# Patient Record
Sex: Male | Born: 1935 | Race: Black or African American | Hispanic: No | Marital: Married | Smoking: Never smoker
Health system: Southern US, Community
[De-identification: ages and names within clinical notes are randomized; demographics above are authoritative.]

## PROBLEM LIST (undated history)

## (undated) DIAGNOSIS — R319 Hematuria, unspecified: Secondary | ICD-10-CM

## (undated) DIAGNOSIS — E785 Hyperlipidemia, unspecified: Secondary | ICD-10-CM

## (undated) DIAGNOSIS — IMO0002 Reserved for concepts with insufficient information to code with codable children: Secondary | ICD-10-CM

## (undated) DIAGNOSIS — N400 Enlarged prostate without lower urinary tract symptoms: Secondary | ICD-10-CM

## (undated) DIAGNOSIS — I442 Atrioventricular block, complete: Secondary | ICD-10-CM

## (undated) DIAGNOSIS — Z85038 Personal history of other malignant neoplasm of large intestine: Secondary | ICD-10-CM

## (undated) DIAGNOSIS — I2510 Atherosclerotic heart disease of native coronary artery without angina pectoris: Secondary | ICD-10-CM

## (undated) DIAGNOSIS — H409 Unspecified glaucoma: Secondary | ICD-10-CM

## (undated) DIAGNOSIS — H209 Unspecified iridocyclitis: Secondary | ICD-10-CM

## (undated) DIAGNOSIS — I10 Essential (primary) hypertension: Secondary | ICD-10-CM

## (undated) HISTORY — DX: Benign prostatic hyperplasia without lower urinary tract symptoms: N40.0

## (undated) HISTORY — DX: Personal history of other malignant neoplasm of large intestine: Z85.038

## (undated) HISTORY — DX: Unspecified iridocyclitis: H20.9

## (undated) HISTORY — DX: Unspecified glaucoma: H40.9

## (undated) HISTORY — DX: Atrioventricular block, complete: I44.2

## (undated) HISTORY — DX: Hematuria, unspecified: R31.9

## (undated) HISTORY — DX: Reserved for concepts with insufficient information to code with codable children: IMO0002

## (undated) HISTORY — DX: Atherosclerotic heart disease of native coronary artery without angina pectoris: I25.10

## (undated) HISTORY — DX: Hyperlipidemia, unspecified: E78.5

## (undated) HISTORY — DX: Essential (primary) hypertension: I10

## (undated) HISTORY — PX: CARDIAC CATHETERIZATION: SHX172

---

## 2000-05-30 HISTORY — PX: TRANSURETHRAL RESECTION OF PROSTATE: SHX73

## 2001-01-27 ENCOUNTER — Emergency Department (HOSPITAL_COMMUNITY): Admission: EM | Admit: 2001-01-27 | Discharge: 2001-01-27 | Payer: Self-pay | Admitting: Emergency Medicine

## 2001-01-29 ENCOUNTER — Emergency Department (HOSPITAL_COMMUNITY): Admission: EM | Admit: 2001-01-29 | Discharge: 2001-01-29 | Payer: Self-pay | Admitting: *Deleted

## 2001-02-02 ENCOUNTER — Encounter: Payer: Self-pay | Admitting: Urology

## 2001-02-06 ENCOUNTER — Inpatient Hospital Stay (HOSPITAL_COMMUNITY): Admission: RE | Admit: 2001-02-06 | Discharge: 2001-02-08 | Payer: Self-pay | Admitting: Urology

## 2001-02-06 ENCOUNTER — Encounter (INDEPENDENT_AMBULATORY_CARE_PROVIDER_SITE_OTHER): Payer: Self-pay | Admitting: Specialist

## 2007-03-30 ENCOUNTER — Encounter: Admission: RE | Admit: 2007-03-30 | Discharge: 2007-03-30 | Payer: Self-pay | Admitting: General Surgery

## 2007-04-30 HISTORY — PX: PARTIAL COLECTOMY: SHX5273

## 2007-05-01 ENCOUNTER — Encounter (INDEPENDENT_AMBULATORY_CARE_PROVIDER_SITE_OTHER): Payer: Self-pay | Admitting: General Surgery

## 2007-05-01 ENCOUNTER — Inpatient Hospital Stay (HOSPITAL_COMMUNITY): Admission: RE | Admit: 2007-05-01 | Discharge: 2007-05-05 | Payer: Self-pay | Admitting: Surgery

## 2007-05-09 ENCOUNTER — Ambulatory Visit: Payer: Self-pay | Admitting: Hematology & Oncology

## 2007-05-10 LAB — CBC WITH DIFFERENTIAL/PLATELET
Basophils Absolute: 0.1 10*3/uL (ref 0.0–0.1)
Eosinophils Absolute: 0.5 10*3/uL (ref 0.0–0.5)
HGB: 11.8 g/dL — ABNORMAL LOW (ref 13.0–17.1)
MONO#: 0.4 10*3/uL (ref 0.1–0.9)
MONO%: 4.9 % (ref 0.0–13.0)
NEUT#: 3.8 10*3/uL (ref 1.5–6.5)
RBC: 4.94 10*6/uL (ref 4.20–5.71)
RDW: 13.7 % (ref 11.2–14.6)
WBC: 7.5 10*3/uL (ref 4.0–10.0)
lymph#: 2.6 10*3/uL (ref 0.9–3.3)

## 2007-05-10 LAB — COMPREHENSIVE METABOLIC PANEL
Albumin: 3.5 g/dL (ref 3.5–5.2)
Alkaline Phosphatase: 138 U/L — ABNORMAL HIGH (ref 39–117)
BUN: 20 mg/dL (ref 6–23)
Calcium: 9.1 mg/dL (ref 8.4–10.5)
Chloride: 103 mEq/L (ref 96–112)
Glucose, Bld: 78 mg/dL (ref 70–99)
Potassium: 4.7 mEq/L (ref 3.5–5.3)
Sodium: 139 mEq/L (ref 135–145)
Total Protein: 6.8 g/dL (ref 6.0–8.3)

## 2007-06-07 LAB — CBC WITH DIFFERENTIAL/PLATELET
BASO%: 2.9 % — ABNORMAL HIGH (ref 0.0–2.0)
LYMPH%: 45.5 % (ref 14.0–48.0)
MCHC: 30.1 g/dL — ABNORMAL LOW (ref 32.0–35.9)
MCV: 77.2 fL — ABNORMAL LOW (ref 81.6–98.0)
MONO#: 0.9 10*3/uL (ref 0.1–0.9)
MONO%: 17.3 % — ABNORMAL HIGH (ref 0.0–13.0)
NEUT#: 1.6 10*3/uL (ref 1.5–6.5)
Platelets: 239 10*3/uL (ref 145–400)
RBC: 5.39 10*6/uL (ref 4.20–5.71)
RDW: 10.9 % — ABNORMAL LOW (ref 11.2–14.6)
WBC: 5.2 10*3/uL (ref 4.0–10.0)

## 2007-07-09 ENCOUNTER — Ambulatory Visit (HOSPITAL_COMMUNITY): Admission: RE | Admit: 2007-07-09 | Discharge: 2007-07-09 | Payer: Self-pay | Admitting: Hematology & Oncology

## 2007-08-07 ENCOUNTER — Ambulatory Visit: Payer: Self-pay | Admitting: Hematology & Oncology

## 2007-08-09 LAB — COMPREHENSIVE METABOLIC PANEL
ALT: 12 U/L (ref 0–53)
AST: 18 U/L (ref 0–37)
CO2: 27 mEq/L (ref 19–32)
Calcium: 9.2 mg/dL (ref 8.4–10.5)
Chloride: 108 mEq/L (ref 96–112)
Sodium: 145 mEq/L (ref 135–145)
Total Bilirubin: 0.9 mg/dL (ref 0.3–1.2)
Total Protein: 7.2 g/dL (ref 6.0–8.3)

## 2007-08-09 LAB — CEA: CEA: 1.1 ng/mL (ref 0.0–5.0)

## 2007-08-09 LAB — CBC WITH DIFFERENTIAL/PLATELET
BASO%: 1.3 % (ref 0.0–2.0)
EOS%: 4.5 % (ref 0.0–7.0)
MCHC: 32 g/dL (ref 32.0–35.9)
MONO#: 0.5 10*3/uL (ref 0.1–0.9)
RBC: 5.34 10*6/uL (ref 4.20–5.71)
RDW: 14 % (ref 11.2–14.6)
WBC: 5.2 10*3/uL (ref 4.0–10.0)
lymph#: 1.9 10*3/uL (ref 0.9–3.3)

## 2007-09-25 ENCOUNTER — Inpatient Hospital Stay (HOSPITAL_COMMUNITY): Admission: AD | Admit: 2007-09-25 | Discharge: 2007-09-27 | Payer: Self-pay | Admitting: Internal Medicine

## 2007-09-25 ENCOUNTER — Ambulatory Visit: Payer: Self-pay | Admitting: Hematology & Oncology

## 2007-10-09 ENCOUNTER — Ambulatory Visit (HOSPITAL_COMMUNITY): Admission: RE | Admit: 2007-10-09 | Discharge: 2007-10-09 | Payer: Self-pay | Admitting: Hematology & Oncology

## 2007-10-09 LAB — CBC WITH DIFFERENTIAL/PLATELET
BASO%: 2.1 % — ABNORMAL HIGH (ref 0.0–2.0)
EOS%: 5 % (ref 0.0–7.0)
MCH: 24.9 pg — ABNORMAL LOW (ref 28.0–33.4)
MCHC: 31.9 g/dL — ABNORMAL LOW (ref 32.0–35.9)
MONO#: 0.4 10*3/uL (ref 0.1–0.9)
RBC: 5.15 10*6/uL (ref 4.20–5.71)
WBC: 5.4 10*3/uL (ref 4.0–10.0)
lymph#: 1.6 10*3/uL (ref 0.9–3.3)

## 2007-10-09 LAB — COMPREHENSIVE METABOLIC PANEL
Albumin: 3.6 g/dL (ref 3.5–5.2)
CO2: 28 mEq/L (ref 19–32)
Calcium: 8.9 mg/dL (ref 8.4–10.5)
Chloride: 105 mEq/L (ref 96–112)
Glucose, Bld: 110 mg/dL — ABNORMAL HIGH (ref 70–99)
Potassium: 4.1 mEq/L (ref 3.5–5.3)
Sodium: 139 mEq/L (ref 135–145)
Total Bilirubin: 0.7 mg/dL (ref 0.3–1.2)
Total Protein: 7.3 g/dL (ref 6.0–8.3)

## 2007-10-09 LAB — CEA: CEA: 1.6 ng/mL (ref 0.0–5.0)

## 2007-11-28 ENCOUNTER — Ambulatory Visit (HOSPITAL_COMMUNITY): Admission: RE | Admit: 2007-11-28 | Discharge: 2007-11-28 | Payer: Self-pay | Admitting: Hematology & Oncology

## 2007-12-11 ENCOUNTER — Ambulatory Visit: Payer: Self-pay | Admitting: Hematology & Oncology

## 2007-12-13 LAB — COMPREHENSIVE METABOLIC PANEL
ALT: 11 U/L (ref 0–53)
Albumin: 4.1 g/dL (ref 3.5–5.2)
CO2: 25 mEq/L (ref 19–32)
Calcium: 9.2 mg/dL (ref 8.4–10.5)
Chloride: 106 mEq/L (ref 96–112)
Glucose, Bld: 92 mg/dL (ref 70–99)
Potassium: 4.4 mEq/L (ref 3.5–5.3)
Sodium: 142 mEq/L (ref 135–145)
Total Protein: 7 g/dL (ref 6.0–8.3)

## 2007-12-13 LAB — CBC WITH DIFFERENTIAL (CANCER CENTER ONLY)
BASO%: 0.7 % (ref 0.0–2.0)
HCT: 41.3 % (ref 38.7–49.9)
LYMPH#: 1.8 10*3/uL (ref 0.9–3.3)
MONO#: 0.4 10*3/uL (ref 0.1–0.9)
NEUT#: 2.6 10*3/uL (ref 1.5–6.5)
NEUT%: 51.4 % (ref 40.0–80.0)
RDW: 13.6 % (ref 10.5–14.6)
WBC: 5 10*3/uL (ref 4.0–10.0)

## 2007-12-13 LAB — CEA: CEA: 1.5 ng/mL (ref 0.0–5.0)

## 2008-03-07 ENCOUNTER — Ambulatory Visit (HOSPITAL_BASED_OUTPATIENT_CLINIC_OR_DEPARTMENT_OTHER): Admission: RE | Admit: 2008-03-07 | Discharge: 2008-03-07 | Payer: Self-pay | Admitting: Hematology & Oncology

## 2008-03-12 ENCOUNTER — Ambulatory Visit: Payer: Self-pay | Admitting: Hematology & Oncology

## 2008-03-13 LAB — COMPREHENSIVE METABOLIC PANEL
ALT: 14 U/L (ref 0–53)
BUN: 24 mg/dL — ABNORMAL HIGH (ref 6–23)
CO2: 26 mEq/L (ref 19–32)
Creatinine, Ser: 1.04 mg/dL (ref 0.40–1.50)
Total Bilirubin: 0.8 mg/dL (ref 0.3–1.2)

## 2008-03-13 LAB — CBC WITH DIFFERENTIAL (CANCER CENTER ONLY)
BASO%: 0.7 % (ref 0.0–2.0)
LYMPH#: 2 10*3/uL (ref 0.9–3.3)
LYMPH%: 41.5 % (ref 14.0–48.0)
MONO#: 0.3 10*3/uL (ref 0.1–0.9)
Platelets: 282 10*3/uL (ref 145–400)
RDW: 12.3 % (ref 10.5–14.6)
WBC: 4.8 10*3/uL (ref 4.0–10.0)

## 2008-03-13 LAB — CEA: CEA: 1.7 ng/mL (ref 0.0–5.0)

## 2008-06-30 ENCOUNTER — Ambulatory Visit: Payer: Self-pay | Admitting: Hematology & Oncology

## 2008-07-01 ENCOUNTER — Ambulatory Visit (HOSPITAL_BASED_OUTPATIENT_CLINIC_OR_DEPARTMENT_OTHER): Admission: RE | Admit: 2008-07-01 | Discharge: 2008-07-01 | Payer: Self-pay | Admitting: Hematology & Oncology

## 2008-07-01 ENCOUNTER — Ambulatory Visit: Payer: Self-pay | Admitting: Diagnostic Radiology

## 2008-07-01 LAB — CBC WITH DIFFERENTIAL (CANCER CENTER ONLY)
BASO%: 0.4 % (ref 0.0–2.0)
LYMPH%: 33.2 % (ref 14.0–48.0)
MCH: 23.4 pg — ABNORMAL LOW (ref 28.0–33.4)
MCV: 73 fL — ABNORMAL LOW (ref 82–98)
MONO%: 4.1 % (ref 0.0–13.0)
Platelets: 268 10*3/uL (ref 145–400)
RDW: 12.2 % (ref 10.5–14.6)

## 2008-07-01 LAB — CMP (CANCER CENTER ONLY)
ALT(SGPT): 19 U/L (ref 10–47)
AST: 30 U/L (ref 11–38)
CO2: 30 mEq/L (ref 18–33)
Chloride: 99 mEq/L (ref 98–108)
Sodium: 142 mEq/L (ref 128–145)
Total Bilirubin: 0.8 mg/dl (ref 0.20–1.60)
Total Protein: 7.8 g/dL (ref 6.4–8.1)

## 2008-07-01 LAB — CEA: CEA: 1.5 ng/mL (ref 0.0–5.0)

## 2008-10-24 ENCOUNTER — Ambulatory Visit: Payer: Self-pay | Admitting: Hematology & Oncology

## 2008-10-28 ENCOUNTER — Ambulatory Visit: Payer: Self-pay | Admitting: Diagnostic Radiology

## 2008-10-28 ENCOUNTER — Ambulatory Visit (HOSPITAL_BASED_OUTPATIENT_CLINIC_OR_DEPARTMENT_OTHER): Admission: RE | Admit: 2008-10-28 | Discharge: 2008-10-28 | Payer: Self-pay | Admitting: Hematology & Oncology

## 2008-10-28 LAB — CBC WITH DIFFERENTIAL (CANCER CENTER ONLY)
BASO#: 0 10*3/uL (ref 0.0–0.2)
BASO%: 0.5 % (ref 0.0–2.0)
Eosinophils Absolute: 0.1 10*3/uL (ref 0.0–0.5)
HCT: 41.8 % (ref 38.7–49.9)
HGB: 12.6 g/dL — ABNORMAL LOW (ref 13.0–17.1)
LYMPH#: 1.8 10*3/uL (ref 0.9–3.3)
MONO#: 0.3 10*3/uL (ref 0.1–0.9)
NEUT%: 53.1 % (ref 40.0–80.0)
RBC: 5.59 10*6/uL (ref 4.20–5.70)
RDW: 12.1 % (ref 10.5–14.6)
WBC: 4.8 10*3/uL (ref 4.0–10.0)

## 2008-10-28 LAB — CMP (CANCER CENTER ONLY)
ALT(SGPT): 21 U/L (ref 10–47)
BUN, Bld: 19 mg/dL (ref 7–22)
CO2: 29 mEq/L (ref 18–33)
Calcium: 9.4 mg/dL (ref 8.0–10.3)
Chloride: 103 mEq/L (ref 98–108)
Creat: 1.2 mg/dl (ref 0.6–1.2)
Total Bilirubin: 0.7 mg/dl (ref 0.20–1.60)

## 2008-10-28 LAB — CEA: CEA: 1.7 ng/mL (ref 0.0–5.0)

## 2008-11-27 HISTORY — PX: PACEMAKER INSERTION: SHX728

## 2008-12-21 ENCOUNTER — Inpatient Hospital Stay (HOSPITAL_COMMUNITY): Admission: EM | Admit: 2008-12-21 | Discharge: 2008-12-23 | Payer: Self-pay | Admitting: Emergency Medicine

## 2008-12-21 ENCOUNTER — Encounter (INDEPENDENT_AMBULATORY_CARE_PROVIDER_SITE_OTHER): Payer: Self-pay | Admitting: Internal Medicine

## 2008-12-21 ENCOUNTER — Ambulatory Visit: Payer: Self-pay | Admitting: Internal Medicine

## 2008-12-23 ENCOUNTER — Emergency Department (HOSPITAL_COMMUNITY): Admission: EM | Admit: 2008-12-23 | Discharge: 2008-12-23 | Payer: Self-pay | Admitting: Emergency Medicine

## 2009-03-17 ENCOUNTER — Ambulatory Visit: Payer: Self-pay | Admitting: Hematology & Oncology

## 2009-03-18 ENCOUNTER — Ambulatory Visit (HOSPITAL_BASED_OUTPATIENT_CLINIC_OR_DEPARTMENT_OTHER): Admission: RE | Admit: 2009-03-18 | Discharge: 2009-03-18 | Payer: Self-pay | Admitting: Hematology & Oncology

## 2009-03-18 ENCOUNTER — Ambulatory Visit: Payer: Self-pay | Admitting: Diagnostic Radiology

## 2009-03-18 LAB — CBC WITH DIFFERENTIAL (CANCER CENTER ONLY)
BASO%: 0.5 % (ref 0.0–2.0)
Eosinophils Absolute: 0.2 10*3/uL (ref 0.0–0.5)
LYMPH#: 2 10*3/uL (ref 0.9–3.3)
MONO#: 0.3 10*3/uL (ref 0.1–0.9)
NEUT#: 2.1 10*3/uL (ref 1.5–6.5)
Platelets: 283 10*3/uL (ref 145–400)
RBC: 5.55 10*6/uL (ref 4.20–5.70)
RDW: 12 % (ref 10.5–14.6)
WBC: 4.6 10*3/uL (ref 4.0–10.0)

## 2009-03-18 LAB — CMP (CANCER CENTER ONLY)
ALT(SGPT): 20 U/L (ref 10–47)
Albumin: 3.6 g/dL (ref 3.3–5.5)
Alkaline Phosphatase: 125 U/L — ABNORMAL HIGH (ref 26–84)
CO2: 30 mEq/L (ref 18–33)
Glucose, Bld: 100 mg/dL (ref 73–118)
Potassium: 4.6 mEq/L (ref 3.3–4.7)
Sodium: 143 mEq/L (ref 128–145)
Total Bilirubin: 0.8 mg/dl (ref 0.20–1.60)
Total Protein: 7.6 g/dL (ref 6.4–8.1)

## 2009-06-25 ENCOUNTER — Ambulatory Visit: Payer: Self-pay | Admitting: Hematology & Oncology

## 2009-09-21 ENCOUNTER — Ambulatory Visit: Payer: Self-pay | Admitting: Hematology & Oncology

## 2009-09-22 ENCOUNTER — Ambulatory Visit: Payer: Self-pay | Admitting: Diagnostic Radiology

## 2009-09-22 ENCOUNTER — Ambulatory Visit (HOSPITAL_BASED_OUTPATIENT_CLINIC_OR_DEPARTMENT_OTHER): Admission: RE | Admit: 2009-09-22 | Discharge: 2009-09-22 | Payer: Self-pay | Admitting: Hematology & Oncology

## 2009-09-22 LAB — CBC WITH DIFFERENTIAL (CANCER CENTER ONLY)
BASO#: 0 10*3/uL (ref 0.0–0.2)
EOS%: 2.9 % (ref 0.0–7.0)
Eosinophils Absolute: 0.2 10*3/uL (ref 0.0–0.5)
HGB: 13 g/dL (ref 13.0–17.1)
LYMPH#: 2.2 10*3/uL (ref 0.9–3.3)
MCHC: 30.7 g/dL — ABNORMAL LOW (ref 32.0–35.9)
NEUT#: 2.4 10*3/uL (ref 1.5–6.5)
RBC: 5.58 10*6/uL (ref 4.20–5.70)

## 2009-09-22 LAB — CMP (CANCER CENTER ONLY)
AST: 26 U/L (ref 11–38)
Albumin: 3.7 g/dL (ref 3.3–5.5)
BUN, Bld: 22 mg/dL (ref 7–22)
Calcium: 9.6 mg/dL (ref 8.0–10.3)
Chloride: 104 mEq/L (ref 98–108)
Potassium: 4 mEq/L (ref 3.3–4.7)
Sodium: 143 mEq/L (ref 128–145)
Total Protein: 7.8 g/dL (ref 6.4–8.1)

## 2010-02-02 ENCOUNTER — Ambulatory Visit: Payer: Self-pay | Admitting: Hematology & Oncology

## 2010-02-03 LAB — CBC WITH DIFFERENTIAL (CANCER CENTER ONLY)
BASO#: 0 10*3/uL (ref 0.0–0.2)
BASO%: 0.5 % (ref 0.0–2.0)
Eosinophils Absolute: 0.2 10*3/uL (ref 0.0–0.5)
HGB: 13.4 g/dL (ref 13.0–17.1)
LYMPH#: 1.9 10*3/uL (ref 0.9–3.3)
MCH: 23.9 pg — ABNORMAL LOW (ref 28.0–33.4)
MCV: 76 fL — ABNORMAL LOW (ref 82–98)
MONO#: 0.3 10*3/uL (ref 0.1–0.9)
MONO%: 6.2 % (ref 0.0–13.0)
NEUT%: 51 % (ref 40.0–80.0)
WBC: 4.9 10*3/uL (ref 4.0–10.0)

## 2010-02-03 LAB — COMPREHENSIVE METABOLIC PANEL
Albumin: 4 g/dL (ref 3.5–5.2)
BUN: 20 mg/dL (ref 6–23)
Calcium: 9.3 mg/dL (ref 8.4–10.5)
Chloride: 106 mEq/L (ref 96–112)
Glucose, Bld: 94 mg/dL (ref 70–99)
Potassium: 4.4 mEq/L (ref 3.5–5.3)

## 2010-06-20 ENCOUNTER — Encounter: Payer: Self-pay | Admitting: Hematology & Oncology

## 2010-08-05 ENCOUNTER — Other Ambulatory Visit: Payer: Self-pay | Admitting: Family

## 2010-08-05 ENCOUNTER — Other Ambulatory Visit: Payer: Self-pay | Admitting: Hematology & Oncology

## 2010-08-05 ENCOUNTER — Encounter (HOSPITAL_BASED_OUTPATIENT_CLINIC_OR_DEPARTMENT_OTHER): Payer: Medicare Other | Admitting: Hematology & Oncology

## 2010-08-05 DIAGNOSIS — C189 Malignant neoplasm of colon, unspecified: Secondary | ICD-10-CM

## 2010-08-05 LAB — COMPREHENSIVE METABOLIC PANEL
ALT: 14 U/L (ref 0–53)
BUN: 19 mg/dL (ref 6–23)
CO2: 28 mEq/L (ref 19–32)
Calcium: 9.2 mg/dL (ref 8.4–10.5)
Creatinine, Ser: 1.1 mg/dL (ref 0.40–1.50)
Glucose, Bld: 105 mg/dL — ABNORMAL HIGH (ref 70–99)
Total Bilirubin: 1 mg/dL (ref 0.3–1.2)

## 2010-08-05 LAB — CBC WITH DIFFERENTIAL (CANCER CENTER ONLY)
BASO#: 0.1 10*3/uL (ref 0.0–0.2)
Eosinophils Absolute: 0.3 10*3/uL (ref 0.0–0.5)
HCT: 42.1 % (ref 38.7–49.9)
HGB: 13.5 g/dL (ref 13.0–17.1)
LYMPH#: 1.7 10*3/uL (ref 0.9–3.3)
LYMPH%: 31.1 % (ref 14.0–48.0)
MCV: 73 fL — ABNORMAL LOW (ref 82–98)
MONO#: 0.6 10*3/uL (ref 0.1–0.9)
NEUT%: 50.6 % (ref 40.0–80.0)
RBC: 5.74 10*6/uL — ABNORMAL HIGH (ref 4.20–5.70)
WBC: 5.5 10*3/uL (ref 4.0–10.0)

## 2010-08-05 LAB — CEA: CEA: 1.7 ng/mL (ref 0.0–5.0)

## 2010-09-05 LAB — BASIC METABOLIC PANEL
BUN: 13 mg/dL (ref 6–23)
CO2: 26 mEq/L (ref 19–32)
Chloride: 106 mEq/L (ref 96–112)
Glucose, Bld: 140 mg/dL — ABNORMAL HIGH (ref 70–99)
Potassium: 3.5 mEq/L (ref 3.5–5.1)

## 2010-09-05 LAB — CBC
HCT: 37.2 % — ABNORMAL LOW (ref 39.0–52.0)
HCT: 37.7 % — ABNORMAL LOW (ref 39.0–52.0)
HCT: 40.7 % (ref 39.0–52.0)
Hemoglobin: 11.9 g/dL — ABNORMAL LOW (ref 13.0–17.0)
MCHC: 31.7 g/dL (ref 30.0–36.0)
MCV: 74.3 fL — ABNORMAL LOW (ref 78.0–100.0)
MCV: 75.1 fL — ABNORMAL LOW (ref 78.0–100.0)
Platelets: 205 10*3/uL (ref 150–400)
Platelets: 217 10*3/uL (ref 150–400)
Platelets: 255 10*3/uL (ref 150–400)
RDW: 15 % (ref 11.5–15.5)
WBC: 8.1 10*3/uL (ref 4.0–10.5)
WBC: 8.1 10*3/uL (ref 4.0–10.5)
WBC: 8.2 10*3/uL (ref 4.0–10.5)

## 2010-09-05 LAB — POCT I-STAT, CHEM 8
BUN: 22 mg/dL (ref 6–23)
Calcium, Ion: 1.13 mmol/L (ref 1.12–1.32)
Creatinine, Ser: 1.2 mg/dL (ref 0.4–1.5)
Glucose, Bld: 108 mg/dL — ABNORMAL HIGH (ref 70–99)
Hemoglobin: 13.9 g/dL (ref 13.0–17.0)
Sodium: 145 mEq/L (ref 135–145)
TCO2: 25 mmol/L (ref 0–100)

## 2010-09-05 LAB — URINALYSIS, MICROSCOPIC ONLY
Ketones, ur: NEGATIVE mg/dL
Leukocytes, UA: NEGATIVE
Nitrite: NEGATIVE
Protein, ur: NEGATIVE mg/dL
Urobilinogen, UA: 1 mg/dL (ref 0.0–1.0)

## 2010-09-05 LAB — COMPREHENSIVE METABOLIC PANEL
AST: 29 U/L (ref 0–37)
Albumin: 3.4 g/dL — ABNORMAL LOW (ref 3.5–5.2)
Alkaline Phosphatase: 102 U/L (ref 39–117)
BUN: 18 mg/dL (ref 6–23)
Chloride: 107 mEq/L (ref 96–112)
GFR calc Af Amer: >60 mL/min (ref >60)
Potassium: 4 mEq/L (ref 3.5–5.1)
Total Bilirubin: 0.8 mg/dL (ref 0.3–1.2)
Total Protein: 6.4 g/dL (ref 6.0–8.3)

## 2010-09-05 LAB — CARDIAC PANEL(CRET KIN+CKTOT+MB+TROPI)
CK, MB: 2.2 ng/mL (ref 0.3–4.0)
CK, MB: 2.5 ng/mL (ref 0.3–4.0)
Relative Index: 1.8 (ref 0.0–2.5)
Relative Index: 2.4 (ref 0.0–2.5)
Total CK: 106 U/L (ref 7–232)
Total CK: 122 U/L (ref 7–232)
Troponin I: 0.03 ng/mL (ref 0.00–0.06)

## 2010-09-05 LAB — DIFFERENTIAL
Basophils Relative: 0 % (ref 0–1)
Eosinophils Absolute: 0.1 10*3/uL (ref 0.0–0.7)
Eosinophils Absolute: 0.3 10*3/uL (ref 0.0–0.7)
Eosinophils Relative: 0 % (ref 0–5)
Eosinophils Relative: 2 % (ref 0–5)
Eosinophils Relative: 3 % (ref 0–5)
Lymphocytes Relative: 34 % (ref 12–46)
Lymphocytes Relative: 7 % — ABNORMAL LOW (ref 12–46)
Lymphs Abs: 1.4 10*3/uL (ref 0.7–4.0)
Lymphs Abs: 2.8 10*3/uL (ref 0.7–4.0)
Monocytes Absolute: 0.1 10*3/uL (ref 0.1–1.0)
Monocytes Absolute: 0.8 10*3/uL (ref 0.1–1.0)
Monocytes Relative: 1 % — ABNORMAL LOW (ref 3–12)
Monocytes Relative: 9 % (ref 3–12)
Neutro Abs: 7.5 10*3/uL (ref 1.7–7.7)

## 2010-09-05 LAB — TSH: TSH: 0.929 u[IU]/mL (ref 0.350–4.500)

## 2010-09-05 LAB — PROTIME-INR: Prothrombin Time: 14.5 seconds (ref 11.6–15.2)

## 2010-09-05 LAB — URINE CULTURE
Colony Count: NO GROWTH
Culture: NO GROWTH

## 2010-09-05 LAB — SAMPLE TO BLOOD BANK

## 2010-09-05 LAB — MRSA PCR SCREENING: MRSA by PCR: NEGATIVE

## 2010-09-05 LAB — POCT CARDIAC MARKERS: Myoglobin, poc: 66.9 ng/mL (ref 12–200)

## 2010-09-05 LAB — APTT: aPTT: 26 seconds (ref 24–37)

## 2010-09-17 ENCOUNTER — Ambulatory Visit (HOSPITAL_BASED_OUTPATIENT_CLINIC_OR_DEPARTMENT_OTHER)
Admission: RE | Admit: 2010-09-17 | Discharge: 2010-09-17 | Disposition: A | Discharge status: Home / Self Care | Payer: Medicare Other | Source: Ambulatory Visit | Attending: Hematology & Oncology | Admitting: Hematology & Oncology

## 2010-09-17 DIAGNOSIS — C189 Malignant neoplasm of colon, unspecified: Secondary | ICD-10-CM | POA: Insufficient documentation

## 2010-09-17 MED ORDER — IOHEXOL 300 MG/ML  SOLN
100.0000 mL | Freq: Once | INTRAMUSCULAR | Status: AC | PRN
  Administered 2010-09-17: 100 mL via INTRAVENOUS

## 2010-10-12 NOTE — Discharge Summary (Signed)
[name], [name]              ACCOUNT NO.:  [id number]   MEDICAL RECORD NO.:  [id number]          PATIENT TYPE:  INP   LOCATION:  1532                         FACILITY:  [facility]   PHYSICIAN:  Adolph Pollack, M.D.DATE OF BIRTH:  10/08/1924   DATE OF ADMISSION:  05/01/2007  DATE OF DISCHARGE:  05/05/2007                               DISCHARGE SUMMARY   FINAL DIAGNOSIS:  Stage III colon cancer (T1N1).   SECONDARY DIAGNOSES:  1. Mild postoperative ileus.  2. Hypertension.  3. Glaucoma.   PROCEDURE:  Laparoscopic-assisted sigmoid colectomy.   REASON FOR ADMISSION:  This 77 year old male underwent screening  colonoscopy and was found to have a number of colonic polyps.  He had a  polyp in the sigmoid colon that was positive for adenocarcinoma.  He was  admitted for operative intervention.   HOSPITAL COURSE:  He underwent a laparoscopic sigmoid colectomy which he  tolerated well.  Postoperatively, he had a mild postop ileus, but had  return of bowel function by his third postoperative day.  Pathology came  back with 3 of 15 lymph nodes positive for cancer making him stage III  and this was explained to him.  His Foley was removed on the third  postoperative day.  By the fourth postoperative day, he was tolerating  his diet with good bowel activity and he was able to be discharged.   DISPOSITION:  Discharged to home May 05, 2007 in satisfactory  condition.   DISCHARGE INSTRUCTIONS:  1. He will return in about 4 days to have his staples removed.  2. He was given Vicodin for pain and was given discharge instructions.  3. He was told to continue his home medicines as well.      Adolph Pollack, M.D.  Electronically Signed     TJR/MEDQ  D:  06/19/2007  T:  06/19/2007  Job:  [id number]   cc:   Danise Edge, M.D.  Fax: [phone]

## 2010-10-12 NOTE — H&P (Signed)
[name], [name]              ACCOUNT NO.:  [id number]   MEDICAL RECORD NO.:  [id number]          PATIENT TYPE:  INP   LOCATION:  2304                         FACILITY:  [facility]   PHYSICIAN:  Wendi Snipes, MD DATE OF BIRTH:  [birth year]   DATE OF ADMISSION:  12/21/2008  DATE OF DISCHARGE:                              HISTORY & PHYSICAL   TIME OF EXAM:  12:38 a.m.   CARDIOLOGIST:  None.   PRIMARY DOCTOR:  Dr. Donette Larry.   CHIEF COMPLAINT:  Syncope.   HISTORY OF PRESENT ILLNESS:  This is an89 year old African-American,  active male who was with his family tonight when he felt flushed.  The  family then noted that he passed out for a few moments.  The family  called EMS, and he did this several more times before EMS arrived.  On  arrival EMS found him to be in high degree AV block, and the patient had  several more syncopal episodes prior to arriving the ED.  In the ED he  received dopamine, and transcutaneous pacer pads were placed as he was  continuing to wax and wane in and out of third-degree AV block without  an escape rhythm.  The patient described his symptoms as warm, flushed  feeling prior to his syncopal episodes and denies chest pain, exertional  anginal, lower extremity edema, paroxysmal nocturnal dyspnea, or  orthopnea.  Prior to this episode he has been in his usual state of  health and enjoying a very active lifestyle around his house.   PAST MEDICAL HISTORY:  1. Colon cancer, stage III status post colectomy in December 2008.  2. Prostate cancer.  3. Hypertension.  4. Glaucoma.   ALLERGIES:  No known drug allergies.   MEDICATIONS ON ADMISSION:  Amlodipine 5 mg daily, aspirin 81 mg daily,  and Protonix 40 mg twice daily.   SOCIAL HISTORY:  He lives in [place] with his wife.  He denies ever  using tobacco.   FAMILY HISTORY:  His mother had a pacemaker. Other than that there is no  early coronary artery disease.   REVIEW OF SYSTEMS:  All 14 systems  were reviewed and were negative  except as mentioned detail in the HPI.   PHYSICAL EXAM:  VITAL SIGNS:  His blood pressure is 74/56, respiratory  16.  His pulse is 62 beats per minute.  He is satting 98% on 10 liters  non-rebreather mask  GENERAL:  He is an 77 year old African American male in mild acute  distress.  HEENT: Moist mucous membranes.  Pupils equal, round, reactive to light  accommodation.  Anicteric sclerae.  NECK:  No jugular venous distention.  No thyromegaly.  CARDIOVASCULAR:  Regular rate and rhythm.  No murmurs, rubs or gallops.  LUNGS: Clear to auscultation bilaterally.  ABDOMEN:  Nontender, nondistended.  Positive bowel sounds.  No masses.  EXTREMITIES: No clubbing, cyanosis, edema.  NEUROLOGIC: Alert and oriented x3.  Cranial nerves II-XII grossly  intact.  No focal neurologic deficit.  SKIN:  Warm, dry and intact.  No rashes.  PSYCH:  Mood and affect are appropriate.   Radiology  is pending.  EKG had sinus rhythm at 60 beats per minute with  nonspecific ST changes and a right bundle branch block.  However, on  telemetry there is transient third-degree AV block without escape.   LABORATORY WORK:  Currently pending.   ASSESSMENT/PLAN:  This is an 77 year old African-American male here with  high-degree arteriovenous block and syncope.  1. High degree arteriovenous block.  Will continue transcutaneous      pacing to bridge to the cath lab for placement of her emergent      transvenous pacer wire.  We will start dopamine at this time.  Will      plan to admit him to the hospital and plan for permanent pacemaker      placement prior to his discharge.  Will hold his AV nodal blockers,      though he is not on any, and check an echocardiogram and TSH at      this time.  2. Chest pain. This is very unlikely ischemia.  However, he does have      risk factor and this could represent a right coronary lesion.  Will      check biomarkers and discuss with the  interventional cardiologist      about possible an angiography at the time of pacemaker placement.      Wendi Snipes, MD  Electronically Signed     BHH/MEDQ  D:  12/21/2008  T:  12/21/2008  Job:  [id number]

## 2010-10-12 NOTE — Consult Note (Signed)
[name], [name]              ACCOUNT NO.:  [id number]   MEDICAL RECORD NO.:  [id number]          PATIENT TYPE:  INP   LOCATION:  3021                         FACILITY:  [facility]   PHYSICIAN:  Graylin Shiver, M.D.   DATE OF BIRTH:  [birth year]   DATE OF CONSULTATION:  09/25/2007  DATE OF DISCHARGE:                                 CONSULTATION   REASON FOR CONSULTATION:  The patient is an 77 year old black male  admitted to the hospital today through the office of Dr. Georgann Housekeeper.  The patient was feeling weak, dizzy, and somewhat tired recently.  He  has been having black stools for the past 4 days.  He went to see Dr.  Donette Larry who checked his stool, and it was heme positive and also did a  CBC showing a hemoglobin of around 7.  The patient denies any  hematemesis or abdominal pain.  He has never had a peptic ulcer to his  knowledge.   PAST MEDICAL HISTORY:  Hypertension.   PAST SURGICAL HISTORY:  He had recent laparoscopic-assisted sigmoid  colectomy for colon cancer, prior history also of prostate surgery for  enlarged prostate.   CURRENT MEDICATIONS:  One aspirin daily.  He takes a blood pressure  medicine, does not know the name of it.   SOCIAL HISTORY:  Does not smoke or drink alcohol.   ALLERGIES:  None known.   REVIEW OF SYSTEMS:  Systems reviewed, no chest pain, shortness of  breath, cough, or sputum production.   PHYSICAL EXAMINATION:  GENERAL: He does not appear in any distress.  Ambulated here to the hospital on his own for elective admission.  NECK: Supple.  HEART: Regular rhythm.  No murmurs.  LUNGS: Clear.  ABDOMEN: Soft and nontender.  No hepatosplenomegaly.   IMPRESSION:  1. Melena.  2. Anemia.  3. History of sigmoid colon cancer.   PLAN:  The patient is being admitted to the hospital by Dr. Donette Larry.  Further medical orders will be written by him.  We will set him up for  EGD tomorrow to evaluate upper GI tract, given the fact that he has been  having melena for the past 4 days.  He had a colonoscopy back in  December of last year, I am not convinced with the melena that we need  to repeat this, but we will make further decisions after endoscopy is  done.           ______________________________  Graylin Shiver, M.D.     SFG/MEDQ  D:  09/25/2007  T:  09/26/2007  Job:  [id number]   cc:   Rose Phi. Myna Hidalgo, M.D.  Adolph Pollack, M.D.  Danise Edge, M.D.  Georgann Housekeeper, MD

## 2010-10-12 NOTE — Op Note (Signed)
[name], [name]              ACCOUNT NO.:  [id number]   MEDICAL RECORD NO.:  [id number]          PATIENT TYPE:  INP   LOCATION:  3021                         FACILITY:  [facility]   PHYSICIAN:  Graylin Shiver, M.D.   DATE OF BIRTH:  12-Sep-1924   DATE OF PROCEDURE:  09/26/2007  DATE OF DISCHARGE:                               OPERATIVE REPORT   PROCEDURE PERFORMED:  Esophagogastroduodenoscopy and biopsy for CLO  test.   INDICATIONS:  Melena and anemia.   Informed consent was obtained after explanation of the risks of  bleeding, infection, and perforation.   PREMEDICATION:  Fentanyl 50 mcg IV, Versed 5 mg IV.   PROCEDURE IN DETAIL:  With the patient in the left lateral decubitus  position, the Pentax gastroscope was inserted into the oropharynx and  passed into the esophagus.  It was advanced down the esophagus, then  into the stomach and into the duodenum.  The second portion of the  duodenum looked normal.  In the bulb of the duodenum, there were 2  ulcers, one was a linear ulcer about 1 cm in length and the other was  more of a roundish ulcer.  There was no evidence of active bleeding.  There were no visible vessels.  The stomach was then inspected.  There  was some erythema in the antrum.  A biopsy for CLO test was obtained.  The body of the stomach looked normal.  The fundus and cardia looked  normal.  The esophagus looked normal.  He tolerated the procedure well  without complications.   IMPRESSION:  Two ulcers in the duodenal bulb and some mild gastritis.  The biopsy for CLO test was obtained to look for evidence of  Helicobacter pylori.  There is no evidence of active bleeding.   PLAN:  Continue proton pump inhibitor.  Follow clinically.           ______________________________  Graylin Shiver, M.D.     SFG/MEDQ  D:  09/26/2007  T:  09/26/2007  Job:  [id number]   cc:   Georgann Housekeeper, MD

## 2010-10-12 NOTE — H&P (Signed)
NAMEDAMONTAE, LOPPNOW              ACCOUNT NO.:  [id number]   MEDICAL RECORD NO.:  [id number]          PATIENT TYPE:  INP   LOCATION:  3021                         FACILITY:  [facility]   PHYSICIAN:  Georgann Housekeeper, MD      DATE OF BIRTH:  [birth year]   DATE OF ADMISSION:  09/25/2007  DATE OF DISCHARGE:                              HISTORY & PHYSICAL   CHIEF COMPLAINT:  Black stools and feeling dizzy.   HISTORY OF PRESENT ILLNESS:  An 77 year old male with history of  hypertension, colon cancer, status post colectomy December 2008,  followed by Dr. Myna Hidalgo for oncology, comes in complaining of black  stools since Saturday.  He noticed that he continues to have some black  stools since then.  No fresh blood.  No nausea or vomiting.  No fevers,  no chills.  No chest pain.  He does complain of some dizziness when he  try to get up and walk, but not all the time.  He has been taking  aspirin 81 mg.  No other new medication.  No recent antibiotics.  He has  had recent CT scan by the oncologist, which was negative in the past.  He is scheduled to have another one which is a 55-month follow-up as far  as he is in the office, his blood pressure was stable.  He was not  orthostatic.  Hemoglobin was found to be 7.7 with BUN of 29, mildly  elevated.  His previous blood counts at the oncologist were normal.  The  patient admitted for further workup.   ALLERGIES:  No known drug allergies.   CURRENT MEDICATIONS:  1. Norvasc 5 mg daily.  2. Lumigan eye drops.  3. Aspirin 81 mg daily.   PAST MEDICAL HISTORY:  1. Colon cancer stage III.  2. He has status post colectomy December 2008.  3. Hypertension.  4. Glaucoma.  5. BPH.   SURGICAL HISTORY:  Colectomy.  Last colonoscopy October 2008.   SOCIAL HISTORY:  He is married, one son.  No tobacco, no alcohol.   FAMILY HISTORY:  Noncontributory.   PHYSICAL EXAMINATION:  VITAL SIGNS:  Blood pressure 132/80, standing  130/80, pulse 68, temperature  98.3, and weight of 175.  HEENT:  Pupils reactive.  NECK:  Supple.  LUNGS:  Clear.  CARDIAC:  Regular rhythm without murmurs.  No tachycardia.  ABDOMEN:  Soft, good bowel sounds, nontender, nondistended.  RECTAL:  Guaiac positive, melanotic stool, no fresh blood.   LAB DATA:  Hemoglobin 7.7, white count 10.8, platelets 444, sodium 142,  potassium 3.9, BUN of 29, and creatinine 1.1.  LFTs are normal.  Sugar  115.   IMPRESSION:  An 77 year old male with black stools, dizziness, possible  GI bleed, probable upper GI, history of colon cancer, and history of  dizziness.   PLAN:  Admit to hospital for IV fluids, transfuse 2 units of blood  products, follow H&H every 6 hours, GI consult, hold aspirin, start him  on Protonix IV for hypertension, hold Norvasc, colon cancer.  Last  colonoscopy October 2008.  He is followed by Dr. Myna Hidalgo, who  supposed  to get another CT scan.  We will get a workup first with a GI, and see  if he need the CT scan, we can do it in the hospital or outpatient.      Georgann Housekeeper, MD  Electronically Signed     KH/MEDQ  D:  09/25/2007  T:  09/26/2007  Job:  [id number]

## 2010-10-12 NOTE — Op Note (Signed)
[name], [name]              ACCOUNT NO.:  [id number]   MEDICAL RECORD NO.:  [id number]          PATIENT TYPE:  INP   LOCATION:  0006                         [facility]ACILITY[facility]   PHYSICIAN:  Adolph Pollack, M.D.DATE OF BIRTH:  [birth year]   DATE OF PROCEDURE:  05/01/2007  DATE OF DISCHARGE:                               OPERATIVE REPORT   PREOP DIAGNOSIS:  Sigmoid colon cancer.   POSTOPERATIVE DIAGNOSIS:  Sigmoid colon cancer.   PROCEDURE:  Laparoscopic-assisted sigmoid colectomy with partial  mobilization of splenic flexure.   SURGEON:  Avel Peace, M.D.   ASSISTANT:  Claud Kelp, MD   ANESTHESIA:  General.   INDICATIONS:  [name] is an 77 year old male undergoing a screening  colonoscopy and some polyps were found.  A polyp at 40 cm was biopsied  and was positive for adenocarcinoma.  This area was tattooed.  CEA level  preop is normal.  A CT scan demonstrates no evidence of metastatic  disease.  He now presents for elective partial colectomy.   TECHNIQUE:  He is seen in the holding area and brought to the operating  room, placed supine on the operating table and a general anesthetic was  administered.  A Foley catheter was inserted.  He was placed in the  lithotomy position and the hair on the abdominal wall was clipped and  the area was sterilely prepped and draped.  A small supraumbilical  incision was made through the skin, subcutaneous tissue, fascia and  peritoneum entering the peritoneal cavity under direct vision.  A  pursestring suture of zero Vicryl was placed around the fascial edges.  A Hassan trocar was introduced into the peritoneal cavity.  Pneumoperitoneum created by insufflation of CO2 gas.   The angled laparoscope was introduced under direct vision.  A 5 mm  trocar was placed through a small incision in the right lower quadrant.  A bowel clamp was then inserted through the abdominal cavity.  The  sigmoid colon was identified.  I began  manipulating it and around the  mid sigmoid area I noticed the tattoo mark.  A 5 mm trocar was then  placed in the suprapubic region and one in the left lower quadrant.  I  then divided the lateral attachments of the descending and sigmoid colon  down to the rectosigmoid junction and used blunt dissection to  medialized the colon.  I identified the gonadal vessels and kept my  plane of dissection anterior to these so as to avoid the ureter.  I then  approached the splenic flexure and partially immobilized it, dividing  attachments using electrocautery.  There was a lot of mobility of the  descending colon and rectosigmoid junction.  I was able to bring the two  together without any significant tension.   Following this I removed the 5 mm trocar in the suprapubic region and  made an extraction site longitudinal midline incision through the skin,  subcutaneous tissue, fascia and peritoneum.  The wound protection device  was placed.  I then brought up the sigmoid colon into the wound.  I  divided it at the  descending colon, sigmoid colon junction and near the  rectosigmoid junction with the GIA stapler.  A wedge resection of the  mesentery was performed using the LigaSure, staying above the plane of  the ureter.  The distal specimen was marked with a suture.  I brought  this specimen to the back table, opened it up and noted the polypectomy  site which was right where the tattoo was.  This was sent to pathology.   Gloves were changed.  I then was able to approximate the descending  colon and rectosigmoid junction in a side-to-side type fashion.  I  created a side-to-side stapled anastomosis with the GIA stapler.  The  common defect was closed with a linear non cutting stapler.  A crotch  stitch of 3-0 silk was placed.  The anastomosis was patent, viable,  under no tension, and placed back in the abdominal cavity.   Gloves again were changed.  The abdominal cavity was irrigated with   saline solution.  The air and fluid was evacuated.  I saw no obvious  bleeding was noted.   At this point, needle, sponge and instrument counts were requested and  reported to be correct.  I then closed the fascia of the extraction site  incision with a running #1 PDS suture.  I reinsufflated the abdomen and  inserted the laparoscope.  There was no evidence of bleeding, just some  old blood around the anastomosis which I rinsed off and evacuated.  I  inspected the anastomosis and it was hemostatic.  The midline closure  was solid.   I then released the pneumoperitoneum and removed the trocars.  The  supraumbilical fascial defect was closed by tightening up and tying down  the pursestring suture.  Subcutaneous tissue of the incisions were  irrigated and the skin incisions were then closed with staples followed  by sterile dressings.   He tolerated the procedure without apparent complications and was taken  to the recovery room in satisfactory condition.      Adolph Pollack, M.D.  Electronically Signed     TJR/MEDQ  D:  05/01/2007  T:  05/01/2007  Job:  [id number]   cc:   Georgann Housekeeper, MD  Fax: [phone]   Danise Edge, M.D.  Fax: [phone]

## 2010-10-12 NOTE — Discharge Summary (Signed)
NAMEROCKIE, SCHNOOR              ACCOUNT NO.:  [id number]   MEDICAL RECORD NO.:  [id number]          PATIENT TYPE:  INP   LOCATION:  3021                         FACILITY:  [facility]   PHYSICIAN:  Georgann Housekeeper, MD      DATE OF BIRTH:  08-19-1924   DATE OF ADMISSION:  09/25/2007  DATE OF DISCHARGE:  09/27/2007                               DISCHARGE SUMMARY   DISCHARGE DIAGNOSES:  1. Upper gastrointestinal bleed.  2. Duodenal ulcer.  3. Helicobacter pylori positive.  4. History of colon cancer status post colectomy December 2008.  5. Hypertension.  6. Glaucoma.   MEDICATION ON DISCHARGE:  1. Protonix 40 mg b.i.d.  2. Amoxicillin 500 mg 2 tablets twice a day for 14 days.  3. Biaxin 500 mg b.i.d. for 14 days.  4. Eyedrops as for home use.  5. Norvasc 5 mg on hold, restart on Oct 01, 2007.   CONSULTATION:  GI.   PROCEDURE:  Upper endoscopy.   FINDING:  Two Duodenal ulcers.  No evidence of active bleeding.  No  ulcers in the stomach or esophagus.   LAB DATA:  Hemoglobin at the time of discharge stable at 10.5, white  count of 9.4, BUN of 15, creatinine 1.1, potassium 3.7.  LFTs were  normal.   HOSPITAL COURSE:  An 77 year old patient with history of colon CA status  post colectomy in December 2008, presents to the office with dizziness  and black stools and melena for about 2 days.  He was hemodynamically  stable, found to have hemoglobin of 7.7 with elevated BUN.  GI.  The patient was admitted to the hospital with IV fluids and had a  transfusion of 2 units of packed red blood cells.  His hemoglobin did  increase to 8.5.  He did receive two more units in additional.  His  hemoglobin went up to 11 and stayed stable at 10.5 at the time of  discharge.  The patient had no more bleeding, no vomiting or nausea.  No  abdominal pain.  His aspirin which he was taking 81 mg was on hold.  Continue on Protonix IV b.i.d.  GI consulted, had an upper endoscopy  done revealed two duodenal  ulcers without evidence of active bleeding.  H. pylori test came back positive.  The patient's blood pressure  remained stable in the low 100 range.  His blood pressure medication was  on hold throughout the hospital course.  After the blood transfusion,  his dizziness resolved.  The patient was started on regular diet.  He  will be treated for H. pylori with dual antibiotic regimen plus Protonix  for 14 days.  Stay off the aspirin products and he will continue the  Protonix at least for 6 weeks twice a day.  As for hypertension, I  will hold his Norvasc for another 3 or 4 days.  I will see him back in  the office next week.  He had scheduled for CAT scan as per his  oncologist this week which we will postpone it for 2 weeks after his  treatment with antibiotics and Protonix.  The patient discharged home,  improved.      Georgann Housekeeper, MD  Electronically Signed     KH/MEDQ  D:  09/27/2007  T:  09/27/2007  Job:  [id number]

## 2010-10-12 NOTE — Cardiovascular Report (Signed)
[name], Eddie              ACCOUNT NO.:  [id number]   MEDICAL RECORD NO.:  [id number]          PATIENT TYPE:  INP   LOCATION:  2304                         FACILITY:  [facility]   PHYSICIAN:  Francisca December, M.D.  DATE OF BIRTH:  1924-07-14   DATE OF PROCEDURE:  DATE OF DISCHARGE:                            CARDIAC CATHETERIZATION   PROCEDURES PERFORMED:  1. Left heart catheterization.  2. Left ventriculogram.  3. Coronary angiography.  4. Insertion of temporary transvenous pacemaker.   INDICATIONS:  [name] is a 77 year old male without prior cardiac  history presented to the emergency room this evening with episodes of  syncope.  He was noted to have intermittent complete heart block in the  emergency room with episodes lasting up to 30 seconds.  He denies any  ongoing episodes of chest pain or shortness of breath.  He is brought to  the catheterization laboratory at this time for insertion of a temporary  transvenous pacing wire and coronary angiography to rule out coronary  ischemia as an etiology for his intermittent complete heart block.   PROCEDURE NOTE:  The patient was brought to cardiac catheterization  laboratory where the right groin was prepped and draped in the usual  sterile fashion.  Local anesthesia was obtained with infiltration of 1%  lidocaine.  A 6-French catheter sheath were inserted percutaneously into  the right femoral vein and right femoral artery utilizing an anterior  approach over a guiding J-wire.  A 5-French balloon flow-directed pacing  wire was advanced under fluoroscopy to the right ventricle where it was  placed in the right ventricular apex.  Adequate pacing parameters were  obtained and the temporary pacing unit was placed on backup at 60 beats  per minute and output was 5 MA and sensitivity was 5 MA.  We then  proceeded with coronary angiography in the standard fashion using 6-  [place] #4 left and right Judkins catheters.   Cineangiography at each  coronary artery was conducted in multiple LAO and RAO projections.  The  right Judkins catheter was then exchanged for a 6-French 110-cm pigtail  catheter.  Pressure was recorded in the catheter, in the ascending aorta  and in the left ventricle both, prior to and following the  ventriculogram.  A 30 degrees RAO cine left ventriculogram was performed  utilizing a power injector, 40 mL were injected at 13 mL per second.  At  the completion of the procedure, the arterial catheter and catheter  sheath were removed.  Hemostasis was achieved by direct pressure.  The  patient is transported to the recovery area in stable condition.  The  venous sheath was sutured into place.  Current rhythm is normal sinus at  80 beats a minute.   HEMODYNAMIC:  Systemic arterial pressure was 118/60 with a mean of 82  mmHg.  There was no systolic gradient across the aortic valve.  Left  ventricular end-diastolic pressure was 8 mmHg pre-ventriculogram.   ANGIOGRAPHY:  Left ventriculogram showed normal chamber size and normal  global systolic function.  A visual estimated ejection fraction is 70%.  No clear regional  wall motion abnormalities are seen.  There is no  mitral regurgitation.  The aortic valve is trileaflet and opens normally  during systole.   There is a right-dominant coronary system present.  The main left  coronary artery is normal.   The left anterior descending artery and its branches are moderately  diseased; the anterior descending artery is diffusely diseased and  contains a 30-40% midvessel stenosis.  There is a single moderate-sized  diagonal branch which arises and has an acentric tubular 75% stenosis  proximally.   The left circumflex coronary artery and its branches are without  significant obstruction and amounts to a very large ramus branch with  luminal irregularities only.  The ongoing circumflex itself gives rise  to two marginal branches.  The first of  which is small, the second is  moderate in size and there are no obstructions in these vessels.   The right coronary and its branches are without significant obstruction.  There are luminal irregularities in the proximal portion and the  midportion of the right coronary, there is a discrete 50% stenosis.  The  ongoing right coronary then gives rise to a moderate to large sized  posterior descending artery and a small posterolateral segment with a  single small left ventricular branch.  No obstructions are seen in these  distal branches.   IMPRESSION:  1. Atherosclerotic coronary vascular disease, single vessel.  2. Intact left ventricular size and global systolic function, ejection      fraction 70%.  3. Successful insertion of transvenous pacemaker which did activate      twice during the procedure for approximately 10 seconds each.      Francisca December, M.D.  Electronically Signed     JHE/MEDQ  D:  12/21/2008  T:  12/21/2008  Job:  [id number]

## 2010-10-12 NOTE — Discharge Summary (Signed)
[name], [name]              ACCOUNT NO.:  [id number]   MEDICAL RECORD NO.:  [id number]          PATIENT TYPE:  INP   LOCATION:  3730                         FACILITY:  [facility]   PHYSICIAN:  [name], M.D.  DATE OF BIRTH:  05-22-1925   DATE OF ADMISSION:  12/21/2008  DATE OF DISCHARGE:  12/23/2008                               DISCHARGE SUMMARY   DISCHARGE DIAGNOSES:  1. Intermittent complete heart block with syncope.  2. Status post permanent pacemaker insertion.  3. Hypertension.  4. History of colon carcinoma, stage III, status post colectomy,      Cannon 2008.  5. Glaucoma.  6. Benign prostatic hypertrophy.  7. No evidence of obstructive coronary artery disease by coronary      angiography.   COURSE IN THE HOSPITAL:  Please see the admission H and P for details  dated December 21, 2008.  [name] was admitted with episodes of brief  syncope and brought by EMS to [facility] Emergency Room where he was  documented to have episodes up to 6-10 seconds of complete heart block  that was transient in nature.  There were no ischemic changes on his ECG  during AV conduction.  He has no significant history of cardiac disease.  He was taken urgently to the cardiac catheterization laboratory where a  temporary transvenous pacing wire from the right femoral vein was  inserted.  He also underwent coronary angiography, which revealed no  significant obstructive disease.  There was a 30-40% stenosis in the mid  LAD.  A single moderate-sized diagonal branch did have a 75% stenosis.  The circumflex artery had no obstructions and the right coronary had a  discrete 50% narrowing.  His LV function was intact with an estimated  ejection fraction of 70%.   He was then transported to the Intensive Care Unit where he was observed  for an additional 36 hours pending permanent pacemaker insertion.  The  temporary transvenous pacemaker did activate at a rate of 60 beats per  minute  intermittently.  The right groin remained stable.  The right  femoral arterial sheath had been removed.  On December 22, 2008, Dr. Lady Cannon kindly took the patient to the catheterization laboratory and  inserted a permanent dual-chamber transvenous pacemaker without  complication.  On the day of discharge, the patient is up and  ambulatory.  The site is clean and dry.  A chest x-ray demonstrates no  evidence of pneumothorax and excellent lead placement.  Pacing  parameters are normal.   PERTINENT LABORATORY DATA:  Admission hemogram showed a hemoglobin of  11.9 and hematocrit of 37.2.  White blood cell count is normal.  Platelets 217,000.  PTT and INR are normal on admission.  Serum glucose  was 156.  Serum electrolytes were otherwise normal.  Liver associated  enzymes are normal.  CK-MB and troponin are normal with the exception of  the third set that showed a minimal increase of the relative index to  3.3 and a troponin of 0.08.  TSH is 0.929.   PROCEDURES PERFORMED:  1. Permanent transvenous pacemaker insertion.  2. Temporary transvenous pacemaker.  3. Coronary angiography.  4. Left heart catheterization.  5. Left ventriculogram.   CONDITION ON DISCHARGE:  Improved.   The patient is ambulatory, taking nourishment and without significant  complaint of minimal left shoulder pain associated with his pacemaker  insertion.   DISCHARGE INSTRUCTIONS:  Per the usual [place] post pacemaker  information in instruction sheet.  The patient is to keep the site dry  for an additional 5 days and may shower on December 29, 2008.  There are  specific instructions with regard to movement of the left arm.  Also,  instructions for observation of problems associated with the wound.   FOLLOWUP:  The patient has an appointment to see myself in the office on  December 30, 2008, at 3 p.m.  He should of course call the office sooner  for any increased swelling, increased tenderness, drainage, fever,  or  chills.      [name], M.D.  Electronically Signed     JHE/MEDQ  D:  12/23/2008  T:  12/23/2008  Job:  [id number]   cc:   Eddie Housekeeper, MD  [name]., M.D.

## 2010-10-15 NOTE — Procedures (Signed)
[facility]  Patient:    [name], [name] Visit Number: [id number] MRN: [id number]          Service Type: SUR Location: 1S X003 01 Attending Physician:  Lindaann Slough Dictated by:   Lindaann Slough, M.D. Proc. Date: 02/06/01 Admit Date:  02/06/2001                             Procedure Report  PREOPERATIVE DIAGNOSES:  Urinary retention, benign prostatic hypertrophy.  POSTOPERATIVE DIAGNOSES:  Urinary retention, benign prostatic hypertrophy.  PROCEDURE:  Cystoscopy and transurethral resection of the prostate.  SURGEON:  Lindaann Slough, M.D.  ANESTHESIA:  Spinal.  INDICATION:  Patient is a 77 year old male who was seen in the emergency room on January 27, 2001 in urinary retention.  A Foley catheter was inserted in the bladder.  Patient failed two voiding trials.  Cystoscopy showed trilobar prostatic hypertrophy with trabeculated bladder.  He is now admitted for TURP.  DESCRIPTION OF PROCEDURE:  Under spinal anesthesia, patient was prepped and draped and placed in the dorsal lithotomy position.  A #22 Wappler cystoscope was inserted in the bladder.  Patient has trilobar prostatic hypertrophy with obstruction of the bladder neck.  The bladder is trabeculated.  There is no stone or tumor in the bladder.  The ureteral orifices are in normal position and shape with clear efflux.  The cystoscope was then removed.  The urethra was then dilated with #30-French Sissy Hoff sounds, then a #28 continuous-flow Iglesias cystoscope was inserted in the bladder.  Resection of the prostate gland was done between the 7 and the 10 oclock position and between the 2 and the 5 oclock position, using the bladder neck and the verumontanum as landmarks.  Then the resection was completed between the 10 and the 12 oclock position and between the 5 and the 7 oclock position using the same landmarks.  Hemostasis was secured with electrocautery.  The prostatic chips were  then irrigated out of the bladder.  There was minimal bleeding at the end of the procedure.  The resectoscope was then removed.  A #24 three-way Foley catheter was then inserted in the bladder.  The patient tolerated the procedure well and left the OR in satisfactory condition to postanesthesia care unit. Dictated by:   Lindaann Slough, M.D. Attending Physician:  Lindaann Slough DD:  02/06/01 TD:  02/06/01 Job: [id number]

## 2011-02-04 ENCOUNTER — Encounter (HOSPITAL_BASED_OUTPATIENT_CLINIC_OR_DEPARTMENT_OTHER): Payer: Medicare Other | Admitting: Hematology & Oncology

## 2011-02-04 ENCOUNTER — Other Ambulatory Visit: Payer: Self-pay | Admitting: Family

## 2011-02-04 ENCOUNTER — Encounter: Payer: Medicare Other | Admitting: Hematology & Oncology

## 2011-02-04 DIAGNOSIS — Z85038 Personal history of other malignant neoplasm of large intestine: Secondary | ICD-10-CM

## 2011-02-04 DIAGNOSIS — C187 Malignant neoplasm of sigmoid colon: Secondary | ICD-10-CM

## 2011-02-04 DIAGNOSIS — C772 Secondary and unspecified malignant neoplasm of intra-abdominal lymph nodes: Secondary | ICD-10-CM

## 2011-02-04 DIAGNOSIS — I10 Essential (primary) hypertension: Secondary | ICD-10-CM

## 2011-02-04 DIAGNOSIS — C189 Malignant neoplasm of colon, unspecified: Secondary | ICD-10-CM

## 2011-02-04 LAB — CBC WITH DIFFERENTIAL (CANCER CENTER ONLY)
BASO#: 0.1 10*3/uL (ref 0.0–0.2)
Eosinophils Absolute: 0.2 10*3/uL (ref 0.0–0.5)
HCT: 40.9 % (ref 38.7–49.9)
HGB: 13.8 g/dL (ref 13.0–17.1)
LYMPH#: 1.9 10*3/uL (ref 0.9–3.3)
MCHC: 33.7 g/dL (ref 32.0–35.9)
MONO#: 0.6 10*3/uL (ref 0.1–0.9)
NEUT%: 48.5 % (ref 40.0–80.0)
RBC: 5.6 10*6/uL (ref 4.20–5.70)
WBC: 5.5 10*3/uL (ref 4.0–10.0)

## 2011-02-04 LAB — CEA: CEA: 1.3 ng/mL (ref 0.0–5.0)

## 2011-02-04 LAB — COMPREHENSIVE METABOLIC PANEL
BUN: 23 mg/dL (ref 6–23)
CO2: 25 mEq/L (ref 19–32)
Calcium: 9.9 mg/dL (ref 8.4–10.5)
Chloride: 102 mEq/L (ref 96–112)
Creatinine, Ser: 1.1 mg/dL (ref 0.50–1.35)
Glucose, Bld: 108 mg/dL — ABNORMAL HIGH (ref 70–99)
Total Bilirubin: 0.7 mg/dL (ref 0.3–1.2)

## 2011-02-22 LAB — CROSSMATCH: Antibody Screen: NEGATIVE

## 2011-02-22 LAB — BASIC METABOLIC PANEL
CO2: 26
CO2: 27
Calcium: 7.9 — ABNORMAL LOW
Chloride: 107
Creatinine, Ser: 1.1
Creatinine, Ser: 1.1
GFR calc Af Amer: >60
GFR calc Af Amer: >60
GFR calc non Af Amer: >60
Glucose, Bld: 81
Sodium: 138

## 2011-02-22 LAB — CBC
HCT: 23.7 — ABNORMAL LOW
Hemoglobin: 7.7 — CL
MCHC: 32.6
MCV: 75.9 — ABNORMAL LOW
RBC: 3.13 — ABNORMAL LOW
RBC: 4 — ABNORMAL LOW
RDW: 15.2
WBC: 11.2 — ABNORMAL HIGH

## 2011-02-22 LAB — HEMOGLOBIN AND HEMATOCRIT, BLOOD
HCT: 34.9 — ABNORMAL LOW
HCT: 35.9 — ABNORMAL LOW
Hemoglobin: 10.2 — ABNORMAL LOW
Hemoglobin: 11.4 — ABNORMAL LOW

## 2011-02-22 LAB — COMPREHENSIVE METABOLIC PANEL
Albumin: 3.1 — ABNORMAL LOW
Alkaline Phosphatase: 106
BUN: 29 — ABNORMAL HIGH
CO2: 26
Chloride: 106
Creatinine, Ser: 1.12
GFR calc non Af Amer: >60
Potassium: 3.6
Total Bilirubin: 0.5

## 2011-02-22 LAB — PREPARE RBC (CROSSMATCH)

## 2011-03-07 LAB — BASIC METABOLIC PANEL
BUN: 8
CO2: 30
Calcium: 8.2 — ABNORMAL LOW
Creatinine, Ser: 0.98
Glucose, Bld: 113 — ABNORMAL HIGH

## 2011-03-07 LAB — CBC
MCHC: 32.2
Platelets: 273
RDW: 13.6

## 2011-03-07 LAB — TYPE AND SCREEN
ABO/RH(D): O POS
Antibody Screen: NEGATIVE

## 2011-03-08 LAB — COMPREHENSIVE METABOLIC PANEL
ALT: 18
AST: 29
Albumin: 3.6
Chloride: 104
Creatinine, Ser: 0.98
GFR calc Af Amer: >60
Potassium: 4.5
Sodium: 141
Total Bilirubin: 0.8

## 2011-03-08 LAB — DIFFERENTIAL
Basophils Absolute: 0.1
Eosinophils Relative: 1
Lymphocytes Relative: 27
Monocytes Absolute: 0.7

## 2011-03-08 LAB — CBC
MCHC: 31.4
RBC: 5.6
WBC: 7.1

## 2011-03-08 LAB — CEA: CEA: 2.1

## 2011-06-30 ENCOUNTER — Other Ambulatory Visit: Payer: Self-pay | Admitting: Gastroenterology

## 2011-07-31 IMAGING — CT CT ABD-PELV W/ CM
2 of 4 series · 16 of 46 positions shown, 18 images · IV contrast (APPLIED)
Comparison: 09/22/2009

CLINICAL DATA: Colon cancer

CT ABDOMEN AND PELVIS WITH CONTRAST
TECHNIQUE: Multidetector CT imaging of the abdomen and pelvis was
performed following the standard protocol during bolus
administration of intravenous contrast.
Contrast: 100 ml of omni 300

[Series 2: abd/pelvis 5.0 b31f · axial · 0.70mm/px · z∈[-513,-138]mm · 13 of 83 slices shown, 15 images]
[im 4/83  soft-tissue]
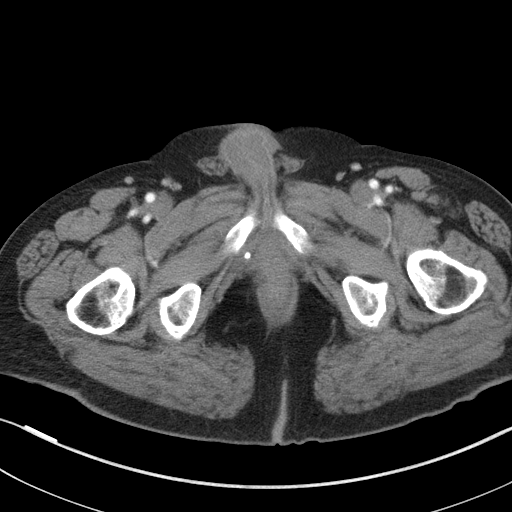
[im 4/83  bone]
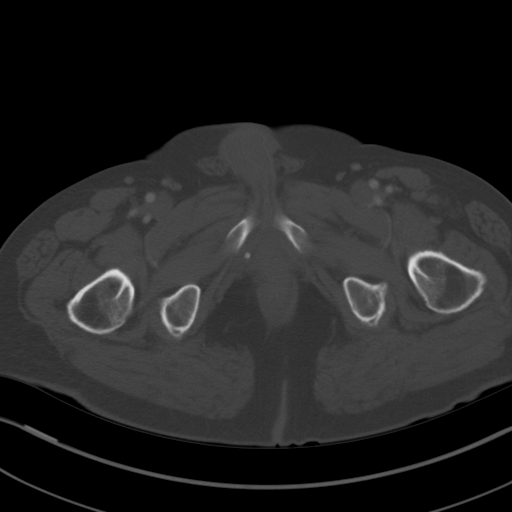
[im 11/83  soft-tissue]
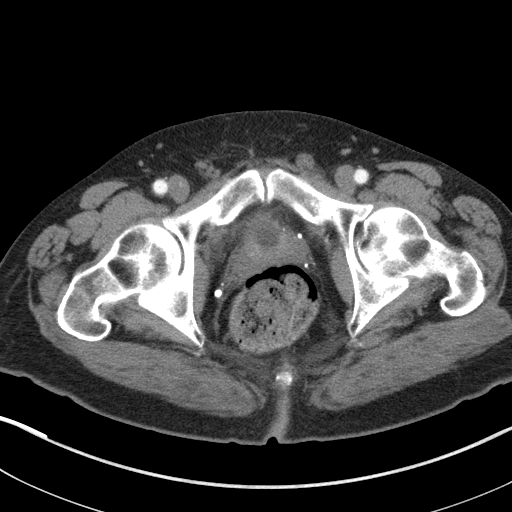
[im 18/83  soft-tissue]
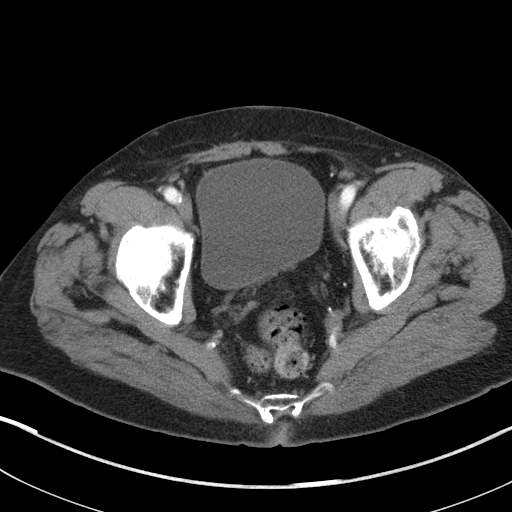
[im 22/83  soft-tissue]
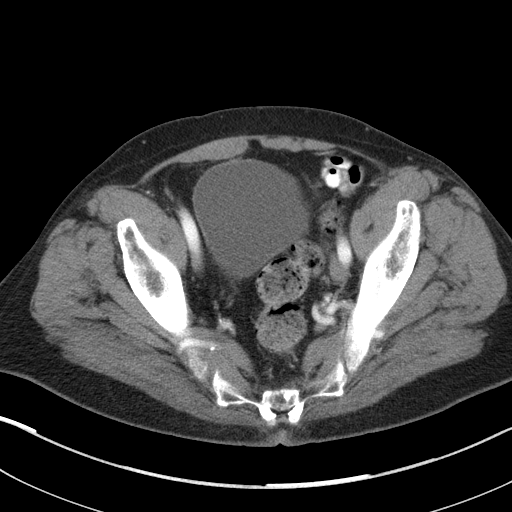
[im 29/83  soft-tissue]
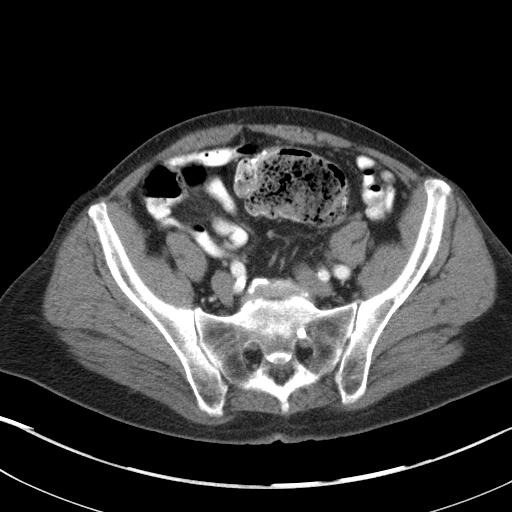
[im 36/83  soft-tissue]
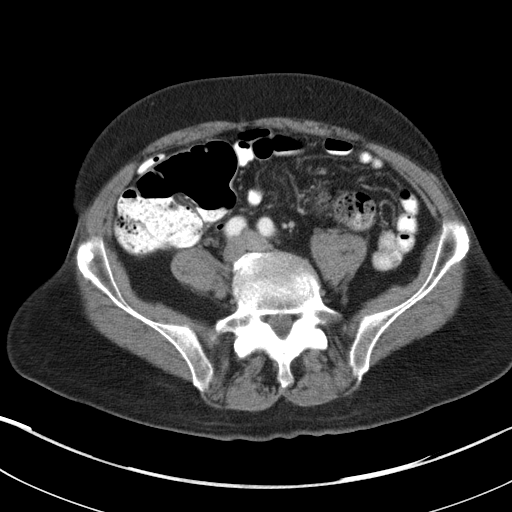
[im 43/83  soft-tissue]
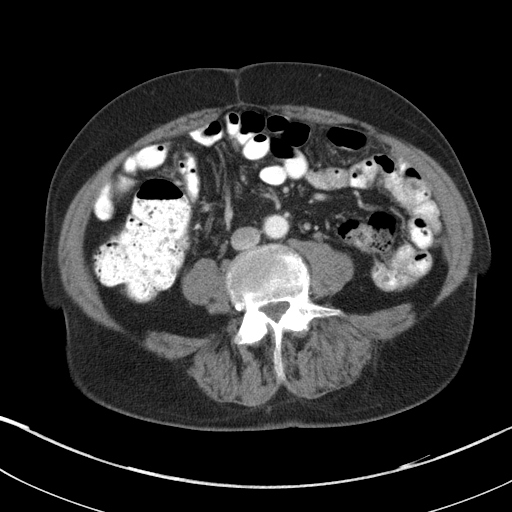
[im 47/83  soft-tissue]
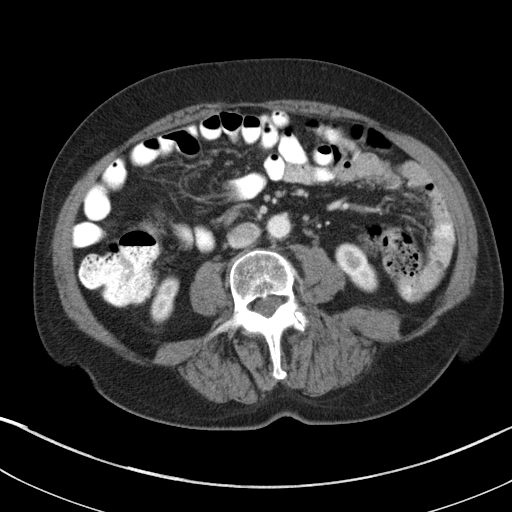
[im 54/83  soft-tissue]
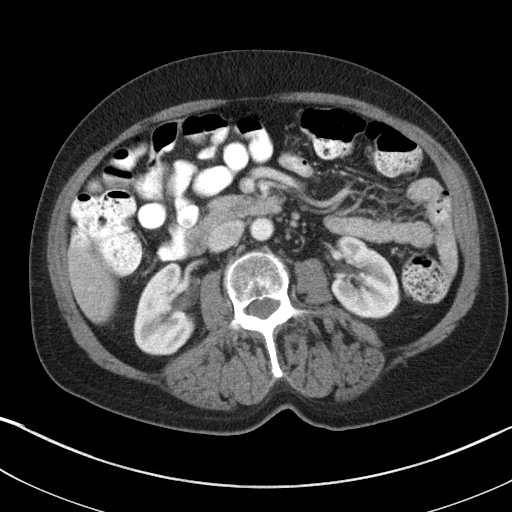
[im 54/83  bone]
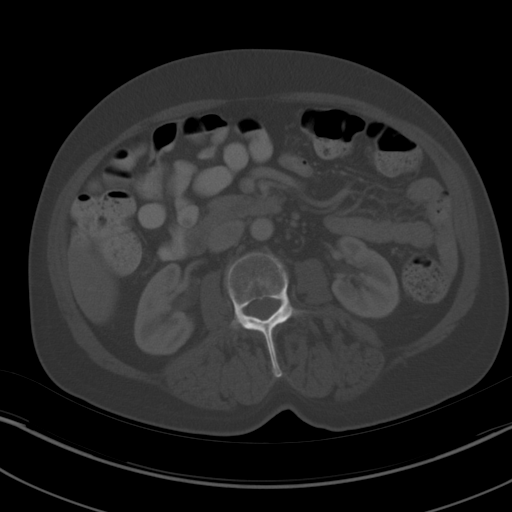
[im 61/83  soft-tissue]
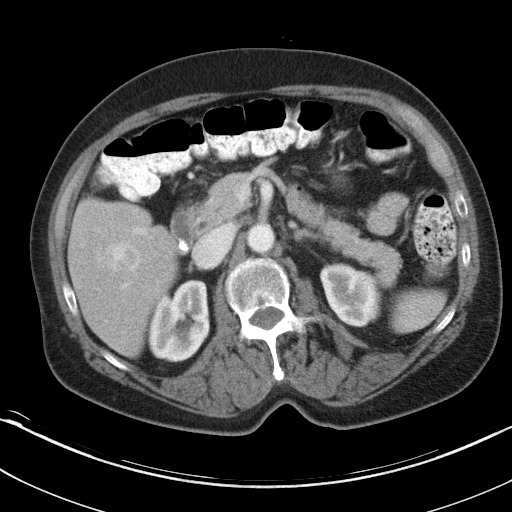
[im 65/83  soft-tissue]
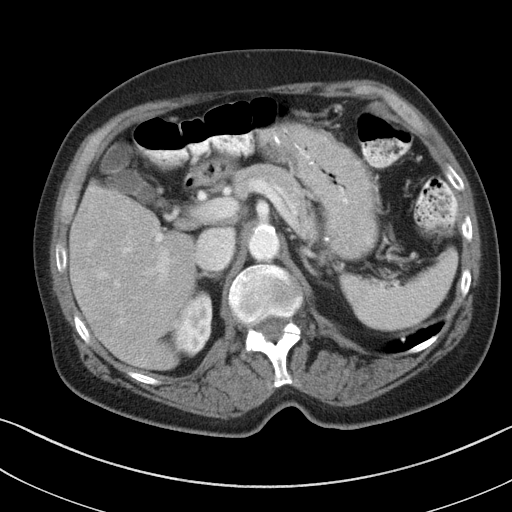
[im 72/83  soft-tissue]
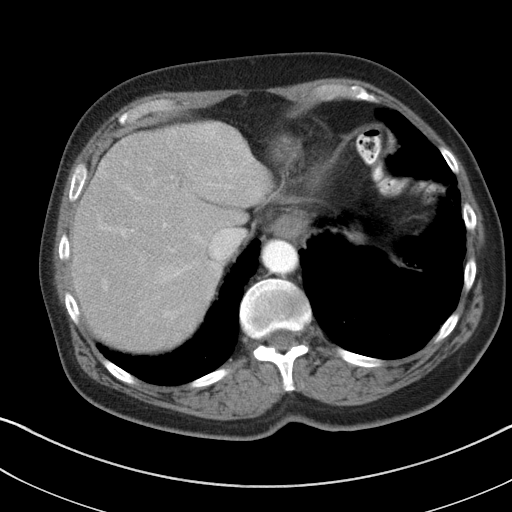
[im 79/83  soft-tissue]
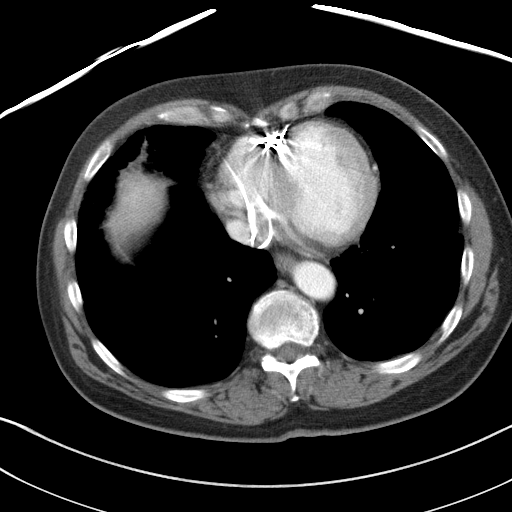

[Series 5: abd/pelvis 3.0 coronal · coronal · 0.68mm/px · 3 of 92 slices shown]
[im 31/92  soft-tissue]
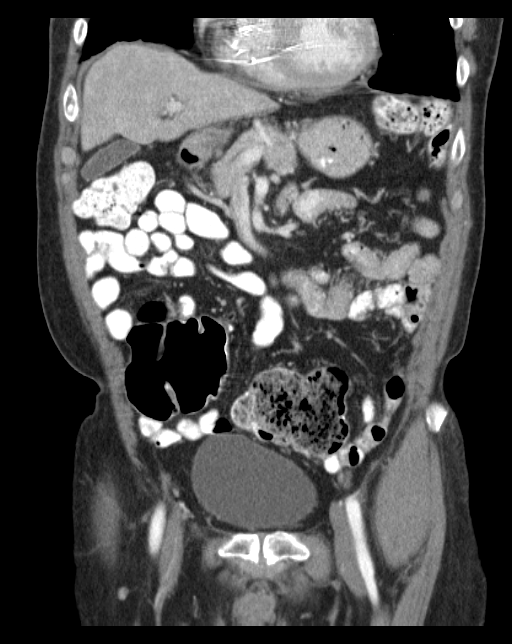
[im 41/92  soft-tissue]
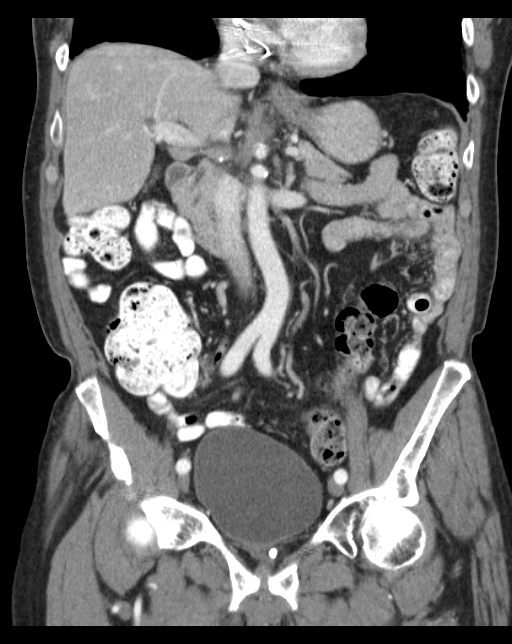
[im 51/92  soft-tissue]
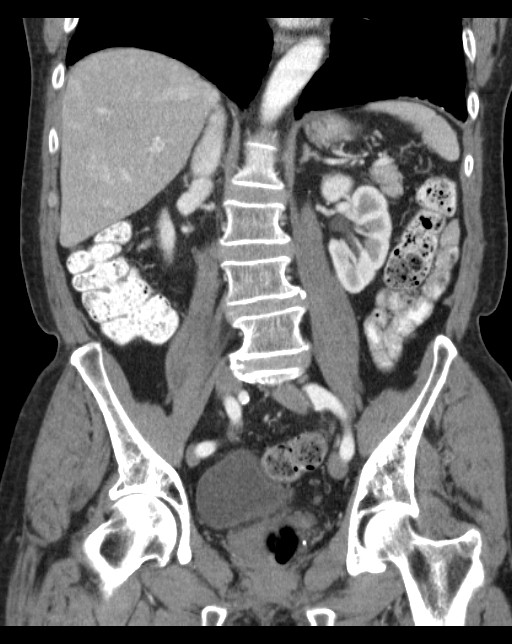

[16 of 46 positions shown; findings below may reference images not displayed]

FINDINGS: Subpleural reticulation is identified within both lung bases.
Findings suggests interstitial lung disease possibly pulmonary
fibrosis.  This is similar to previous exam.

The spleen is normal.

Both adrenal glands appear normal.

Normal appearance of the pancreas.

The left kidney appears normal.

There is a simple appearing cyst within the upper pole of the right
kidney which measures 0.9 cm, image 24.  This is unchanged from
previous exam.

Low density structure within the left hepatic lobe measures 0.5 cm,
image 11.  There is mild heterogeneous fatty infiltration of the
liver.  No additional suspicious liver abnormalities identified.

Gallbladder appears normal.

There is no common bile duct dilatation.  The pancreas has a normal
appearance.

No enlarged upper abdominal lymph nodes.

There is no pelvic or inguinal adenopathy.

Urinary bladder appears normal.

The stomach and small bowel loops appear normal.

The appendix appears normal.

Postoperative changes involving the sigmoid colon again noted.

The appearance is unchanged.

No specific features identified to suggest residual or recurrence
of tumor.
IMPRESSION: 1.  Stable CT of the abdomen and pelvis.
2.  There are no specific features to suggest metastatic disease or
recurrence of tumor.

## 2011-08-03 ENCOUNTER — Telehealth: Payer: Self-pay | Admitting: Hematology & Oncology

## 2011-08-03 NOTE — Telephone Encounter (Signed)
Pt aware of 3-19 appointment 

## 2011-08-03 NOTE — Telephone Encounter (Signed)
Per MD moved 3-8 to 3-19. Unable to reach patient, no answer of voice mail. No other alternative number. Mailed new schedule

## 2011-08-05 ENCOUNTER — Other Ambulatory Visit: Payer: Medicare Other | Admitting: Lab

## 2011-08-05 ENCOUNTER — Ambulatory Visit: Payer: Medicare Other | Admitting: Hematology & Oncology

## 2011-08-16 ENCOUNTER — Ambulatory Visit (HOSPITAL_BASED_OUTPATIENT_CLINIC_OR_DEPARTMENT_OTHER): Payer: Medicare Other | Admitting: Hematology & Oncology

## 2011-08-16 ENCOUNTER — Other Ambulatory Visit (HOSPITAL_BASED_OUTPATIENT_CLINIC_OR_DEPARTMENT_OTHER): Payer: Medicare Other | Admitting: Lab

## 2011-08-16 VITALS — BP 126/82 | HR 76 | Temp 98.1°F | Wt 172.0 lb

## 2011-08-16 DIAGNOSIS — C187 Malignant neoplasm of sigmoid colon: Secondary | ICD-10-CM

## 2011-08-16 DIAGNOSIS — C189 Malignant neoplasm of colon, unspecified: Secondary | ICD-10-CM

## 2011-08-16 DIAGNOSIS — I10 Essential (primary) hypertension: Secondary | ICD-10-CM

## 2011-08-16 LAB — CBC WITH DIFFERENTIAL (CANCER CENTER ONLY)
BASO#: 0.1 10*3/uL (ref 0.0–0.2)
Eosinophils Absolute: 0.2 10*3/uL (ref 0.0–0.5)
HGB: 13.6 g/dL (ref 13.0–17.1)
LYMPH%: 33.6 % (ref 14.0–48.0)
MCH: 24.5 pg — ABNORMAL LOW (ref 28.0–33.4)
MCV: 74 fL — ABNORMAL LOW (ref 82–98)
MONO%: 11.1 % (ref 0.0–13.0)
Platelets: 253 10*3/uL (ref 145–400)
RBC: 5.55 10*6/uL (ref 4.20–5.70)

## 2011-08-16 NOTE — Progress Notes (Signed)
CC:   [name], M.D. [name], M.D. Eddie Housekeeper, MD  DIAGNOSIS:  Stage IIIA (T1 N1 M0) adenocarcinoma of the sigmoid colon.  CURRENT THERAPY:  Observation.  INTERIM HISTORY:  [name] comes in for his followup.  He is doing quite well.  He has had no problems since we last saw him.  He had a colonoscopy in December.  Everything looked fine on the colonoscopy.  He has had no abdominal pain.  He is eating quite well.  He has had no nausea or vomiting.  There has been no cough or shortness of breath.  PHYSICAL EXAMINATION:  General Appearance:  This is a well-developed, well-nourished, black gentleman in no obvious distress.  Vital Signs: 98.1, pulse 76, respiratory rate 16, blood pressure 126/82.  Weight is 172.  Head and Neck Exam:  A normocephalic, atraumatic skull.  There are no ocular or oral lesions.  There are no palpable cervical or supraclavicular lymph nodes.  Lungs:  Clear to percussion and auscultation bilaterally.  Cardiac Exam:  Regular rate and rhythm with a normal S1 and S2.  There are no murmurs, rubs, or bruits.  Abdominal Exam:  Soft with good bowel sounds.  There is no palpable abdominal mass.  He has a laparoscopy scar that is well-healed.  There is no palpable hepatosplenomegaly.  Extremities:  No clubbing, cyanosis, or edema.  Neurological Exam:  No focal neurological deficits.  LABORATORY STUDIES:  White cell count 6.7, hemoglobin 13.6, hematocrit 41.3, platelet count 253.  IMPRESSION:  [name] is an 78 year old, black gentleman with a history of stage IIIA adenocarcinoma of the sigmoid colon.  He is doing quite well.  He did not wish to have any adjuvant chemotherapy.  He is about 5 years out now.  We will plan to get him back yearly for followup.  I do not see any need for any scans.  Of note, his last CT scan was done back in April 2012.  The CT scan looked fantastic with no evidence of recurrent  disease.    ______________________________ [name], M.D. PRE/MEDQ  D:  08/16/2011  T:  08/16/2011  Job:  [id number]   ADDENDUM:  CEA  Is 1.0.

## 2011-08-16 NOTE — Progress Notes (Signed)
This office note has been dictated.

## 2012-07-17 ENCOUNTER — Telehealth: Payer: Self-pay | Admitting: Hematology & Oncology

## 2012-07-17 NOTE — Telephone Encounter (Signed)
Per MD to cx 08/15/12 apt and resch patient to see French Ana.  Apt was resch for 08/15/12 at 9:45.  i called patient and gave the new apt time.  Patient is aware of the apt

## 2012-08-07 ENCOUNTER — Other Ambulatory Visit: Payer: Self-pay | Admitting: Medical

## 2012-08-14 ENCOUNTER — Other Ambulatory Visit: Payer: Self-pay | Admitting: Medical

## 2012-08-14 DIAGNOSIS — Z85038 Personal history of other malignant neoplasm of large intestine: Secondary | ICD-10-CM

## 2012-08-15 ENCOUNTER — Other Ambulatory Visit: Payer: Medicare Other | Admitting: Lab

## 2012-08-15 ENCOUNTER — Ambulatory Visit (HOSPITAL_BASED_OUTPATIENT_CLINIC_OR_DEPARTMENT_OTHER): Payer: Medicare Other | Admitting: Medical

## 2012-08-15 ENCOUNTER — Other Ambulatory Visit (HOSPITAL_BASED_OUTPATIENT_CLINIC_OR_DEPARTMENT_OTHER): Payer: Medicare Other | Admitting: Lab

## 2012-08-15 ENCOUNTER — Ambulatory Visit: Payer: Medicare Other | Admitting: Hematology & Oncology

## 2012-08-15 VITALS — BP 141/79 | HR 56 | Temp 97.2°F | Resp 18 | Ht 70.0 in | Wt 172.0 lb

## 2012-08-15 DIAGNOSIS — Z85038 Personal history of other malignant neoplasm of large intestine: Secondary | ICD-10-CM

## 2012-08-15 LAB — COMPREHENSIVE METABOLIC PANEL
ALT: 15 U/L (ref 0–53)
AST: 25 U/L (ref 0–37)
CO2: 29 mEq/L (ref 19–32)
Calcium: 9.7 mg/dL (ref 8.4–10.5)
Chloride: 102 mEq/L (ref 96–112)
Creatinine, Ser: 1.1 mg/dL (ref 0.50–1.35)
Sodium: 140 mEq/L (ref 135–145)
Total Protein: 7.1 g/dL (ref 6.0–8.3)

## 2012-08-15 LAB — CBC WITH DIFFERENTIAL (CANCER CENTER ONLY)
BASO%: 2 % (ref 0.0–2.0)
EOS%: 4.1 % (ref 0.0–7.0)
LYMPH#: 1.8 10*3/uL (ref 0.9–3.3)
MCHC: 32.7 g/dL (ref 32.0–35.9)
MONO#: 0.5 10*3/uL (ref 0.1–0.9)
NEUT#: 2.7 10*3/uL (ref 1.5–6.5)
NEUT%: 50.6 % (ref 40.0–80.0)
Platelets: 254 10*3/uL (ref 145–400)
RDW: 14 % (ref 11.1–15.7)
WBC: 5.4 10*3/uL (ref 4.0–10.0)

## 2012-08-15 LAB — CEA: CEA: 1.6 ng/mL (ref 0.0–5.0)

## 2012-08-15 NOTE — Progress Notes (Signed)
DIAGNOSIS:  Stage IIIA (T1 N1 M0) adenocarcinoma of the sigmoid colon.  CURRENT THERAPY:  Observation.  INTERIM HISTORY: [name] presents today for an office followup visit.  Overall, he is doing quite well.  He, reports his most recent colonoscopy was back in December and everything looked good.  He's not reporting any new problems.  He is now about 6 years out.  He, reports, a good appetite.  He denies any unintentional weight loss.  He denies any nausea, vomiting, any diarrhea, constipation.  He denies any cough, chest pain, shortness of breath.  He denies any obvious, or abnormal bleeding.  He denies any melena, or hematochezia.  He denies any abdominal pain.  He denies any lower leg swelling.  He denies any visual changes, headaches, or rashes.  Review of Systems: Constitutional:Negative for malaise/fatigue, fever, chills, weight loss, diaphoresis, activity change, appetite change, and unexpected weight change.  HEENT: Negative for double vision, blurred vision, visual loss, ear pain, tinnitus, congestion, rhinorrhea, epistaxis sore throat or sinus disease, oral pain/lesion, tongue soreness Respiratory: Negative for cough, chest tightness, shortness of breath, wheezing and stridor.  Cardiovascular: Negative for chest pain, palpitations, leg swelling, orthopnea, PND, DOE or claudication Gastrointestinal: Negative for nausea, vomiting, abdominal pain, diarrhea, constipation, blood in stool, melena, hematochezia, abdominal distention, anal bleeding, rectal pain, anorexia and hematemesis.  Genitourinary: Negative for dysuria, frequency, hematuria,  Musculoskeletal: Negative for myalgias, back pain, joint swelling, arthralgias and gait problem.  Skin: Negative for rash, color change, pallor and wound.  Neurological:. Negative for dizziness/light-headedness, tremors, seizures, syncope, facial asymmetry, speech difficulty, weakness, numbness, headaches and paresthesias.  Hematological: Negative  for adenopathy. Does not bruise/bleed easily.  Psychiatric/Behavioral:  Negative for depression, no loss of interest in normal activity or change in sleep pattern.   Physical Exam: This is a pleasant, 79 year old, well-developed, well-nourished Afro-American gentleman, in no obvious distress Vitals: Temperature 97.2 degrees, pulse 56, respirations 18, blood pressure 141/79, weight 172 pounds HEENT reveals a normocephalic, atraumatic skull, no scleral icterus, no oral lesions  Neck is supple without any cervical or supraclavicular adenopathy.  Lungs are clear to auscultation bilaterally. There are no wheezes, rales or rhonci Cardiac is regular rate and rhythm with a normal S1 and S2. There are no murmurs, rubs, or bruits.  Abdomen is soft with good bowel sounds, there is no palpable mass. There is no palpable hepatosplenomegaly. There is no palpable fluid wave.  Musculoskeletal no tenderness of the spine, ribs, or hips.  Extremities there are no clubbing, cyanosis, or edema.  Skin no petechia, purpura or ecchymosis Neurologic is nonfocal.  Laboratory Data: White count 5.4, hemoglobin 13.9, hematocrit 42.5.  Platelets 254,000  Current Outpatient Prescriptions on File Prior to Visit  Medication Sig Dispense Refill  . amLODipine (NORVASC) 5 MG tablet Take 5 mg by mouth daily.      Marland Kitchen aspirin 81 MG tablet Take 81 mg by mouth every other day.       . cholecalciferol (VITAMIN D) 1000 UNITS tablet Take 2,000 Units by mouth daily.      . dorzolamide-timolol (COSOPT) 22.3-6.8 MG/ML ophthalmic solution Place 1 drop into both eyes 2 (two) times daily.      . hydrochlorothiazide (HYDRODIURIL) 12.5 MG tablet Take 12.5 mg by mouth daily.      Marland Kitchen omeprazole (PRILOSEC) 20 MG capsule Take 20 mg by mouth daily.      . simvastatin (ZOCOR) 20 MG tablet Take 20 mg by mouth every evening.  No current facility-administered medications on file prior to visit.   Assessment/Plan: This is a pleasant,  79 year old, African American, gentleman, with the following issues:  #1.  Stage IIIa adenocarcinoma of the sigmoid colon.  He did not wish to have any adjuvant chemotherapy.  He has done well.  He now, is about 6 years out.  We will continue to see him on a yearly basis.  There is not any need for scans.  #2.  Followup.  We will follow back up with [name] in one year, but before then should there be questions or concerns.

## 2013-04-02 ENCOUNTER — Telehealth: Payer: Self-pay

## 2013-04-04 MED ORDER — METOPROLOL SUCCINATE ER 25 MG PO TB24: 25.0000 mg | ORAL_TABLET | Freq: Every day | ORAL | Status: DC

## 2013-04-04 NOTE — Telephone Encounter (Signed)
Rx refill

## 2013-05-02 ENCOUNTER — Ambulatory Visit (INDEPENDENT_AMBULATORY_CARE_PROVIDER_SITE_OTHER): Payer: Medicare Other | Admitting: Cardiology

## 2013-05-02 ENCOUNTER — Encounter: Payer: Self-pay | Admitting: Internal Medicine

## 2013-05-02 ENCOUNTER — Encounter: Payer: Self-pay | Admitting: Cardiology

## 2013-05-02 ENCOUNTER — Ambulatory Visit (INDEPENDENT_AMBULATORY_CARE_PROVIDER_SITE_OTHER): Payer: Medicare Other | Admitting: *Deleted

## 2013-05-02 VITALS — BP 132/86 | HR 60 | Ht 72.0 in | Wt 170.8 lb

## 2013-05-02 DIAGNOSIS — R002 Palpitations: Secondary | ICD-10-CM | POA: Insufficient documentation

## 2013-05-02 DIAGNOSIS — I442 Atrioventricular block, complete: Secondary | ICD-10-CM

## 2013-05-02 DIAGNOSIS — Z950 Presence of cardiac pacemaker: Secondary | ICD-10-CM

## 2013-05-02 DIAGNOSIS — I2510 Atherosclerotic heart disease of native coronary artery without angina pectoris: Secondary | ICD-10-CM

## 2013-05-02 DIAGNOSIS — E785 Hyperlipidemia, unspecified: Secondary | ICD-10-CM

## 2013-05-02 HISTORY — DX: Atherosclerotic heart disease of native coronary artery without angina pectoris: I25.10

## 2013-05-02 LAB — MDC_IDC_ENUM_SESS_TYPE_INCLINIC
Battery Remaining Longevity: 94 mo
Battery Voltage: 2.79 V
Lead Channel Impedance Value: 450 Ohm
Lead Channel Impedance Value: 552 Ohm
Lead Channel Pacing Threshold Amplitude: 0.75 V
Lead Channel Setting Pacing Amplitude: 2 V
Lead Channel Setting Pacing Pulse Width: 0.4 ms
Lead Channel Setting Sensing Sensitivity: 2 mV

## 2013-05-02 NOTE — Progress Notes (Signed)
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 Device check in clinic, all functions normal. Inc'd safety output margins: Atrial from 1.75V to 2.0V, RV from 2.0V to 2.5V. Full details in PaceArt. 1 NSVT---8 beats.  Pt has [facility] and has successfully transmitted in past.   ROV w/ Dr. Johney Frame 08/08/13.

## 2013-05-02 NOTE — Progress Notes (Signed)
[address], [address] [place], [place]  [address] Phone: [phone] Fax:  [phone]  Date:  05/02/2013   ID:  [name], DOB 26-Oct-1924, MRN [id number]  PCP:  Adolph Pollack, MD   History of Present Illness: [name] is a 79 y.o. male with hx of pacemaker placed in July 2010 due to heart block. Prior cardiac cath (mild LAD disease of 30-40% stenosis, 75% proximal stenosis of diagonal branch. and medically management) Echocardiogram with normal EF. He returns after seen 07/04/12 due to feeling his heart was fluttering around 2 am lasted 30 minutes then after getting up it resolved. His [facility] provider came to home last week and they noted a slight irregular heart beat and he was anxious. He states he noted this fleeting on occasion while sitting and watching TV and when he gets up and is more active it will go away. No syncope, chest pain, nor GI complaints, no cough nor congestion, no neurological changes. 07/04/12--Pacemaker interrogation noted fleeting < 6 seconds increase heart rate that correlated with his symptoms, and I started pt on Toprol 25 mg qd in hopes of suppressing his palpitaitons.Today he states the flutterying resolved and he is sleeping better. He feels the medication helped him feel alot better. He denies any complaints..  Patient denies chest pain, dizziness, syncope, swelling, SOB, nor PND.  05/02/13-pacemaker interrogation showed normal function. Palpitations are much better. Toprol working well  [employer] Readings from Last 3 Encounters:  05/02/13 170 lb 12.8 oz (77.474 kg)  08/15/12 172 lb (78.019 kg)  08/16/11 172 lb (78.019 kg)     No past medical history on file.  No past surgical history on file.  Current Outpatient Prescriptions  Medication Sig Dispense Refill  . amLODipine (NORVASC) 5 MG tablet Take 5 mg by mouth daily.      Marland Kitchen aspirin 81 MG tablet Take 81 mg by mouth every other day.       . cholecalciferol (VITAMIN D) 1000 UNITS tablet  Take 2,000 Units by mouth daily.      . dorzolamide-timolol (COSOPT) 22.3-6.8 MG/ML ophthalmic solution Place 1 drop into both eyes 2 (two) times daily.      . hydrochlorothiazide (HYDRODIURIL) 12.5 MG tablet Take 12.5 mg by mouth daily.      . metoprolol succinate (TOPROL XL) 25 MG 24 hr tablet Take 1 tablet (25 mg total) by mouth daily.  30 tablet  5  . Multiple Vitamins-Minerals (MULTIVITAMIN PO) Take by mouth every morning.      Marland Kitchen omeprazole (PRILOSEC) 20 MG capsule Take 20 mg by mouth daily.      . simvastatin (ZOCOR) 20 MG tablet Take 20 mg by mouth every evening.       No current facility-administered medications for this visit.    Allergies:   No Known Allergies  Social History:  The patient  reports that he has never smoked. He does not have any smokeless tobacco history on file.   ROS:  Please see the history of present illness.   No syncope, no CP, no SOB. Great exercise tolerance.     PHYSICAL EXAM: VS:  BP 132/86  Pulse 60  Ht 6' (1.829 m)  Wt 170 lb 12.8 oz (77.474 kg)  BMI 23.16 kg/m2 Thin, well developed, in no acute distress HEENT: normal Neck: no JVD Cardiac:  normal S1, S2; RRR; no murmurPacemaker Lungs:  clear to auscultation bilaterally, no wheezing, rhonchi or rales Abd: soft, nontender,  no hepatomegaly Ext: no edema Skin: warm and dry Neuro: no focal abnormalities noted  EKG:  Pacemaker interrogation normal   ASSESSMENT AND PLAN:  1. Pacemaker-functioning well. No changes. 2. Palpitations-improved on metoprolol. Continue. 3. Hyperlipidemia-simvastatin, low-dose. He is having blood work done by Dr. Donette Larry next week. 4. Coronary artery disease-continue with aggressive secondary prevention. Medical management.No active angina. 5. We will see him back in one year.  Signed, Donato Schultz, MD [facility]  05/02/2013 9:42 AM

## 2013-05-02 NOTE — Patient Instructions (Signed)
Your physician recommends that you continue on your current medications as directed. Please refer to the Current Medication list given to you today.  Your physician wants you to follow-up in: 1 year with Dr. Skains. You will receive a reminder letter in the mail two months in advance. If you don't receive a letter, please call our office to schedule the follow-up appointment.  

## 2013-08-08 ENCOUNTER — Encounter: Payer: Self-pay | Admitting: Internal Medicine

## 2013-08-08 ENCOUNTER — Ambulatory Visit (INDEPENDENT_AMBULATORY_CARE_PROVIDER_SITE_OTHER): Payer: Medicare Other | Admitting: Internal Medicine

## 2013-08-08 VITALS — BP 138/78 | HR 72 | Ht 71.0 in | Wt 171.0 lb

## 2013-08-08 DIAGNOSIS — I442 Atrioventricular block, complete: Secondary | ICD-10-CM

## 2013-08-08 DIAGNOSIS — I2510 Atherosclerotic heart disease of native coronary artery without angina pectoris: Secondary | ICD-10-CM

## 2013-08-08 DIAGNOSIS — I10 Essential (primary) hypertension: Secondary | ICD-10-CM | POA: Insufficient documentation

## 2013-08-08 DIAGNOSIS — Z950 Presence of cardiac pacemaker: Secondary | ICD-10-CM

## 2013-08-08 NOTE — Patient Instructions (Signed)
Your physician wants you to follow-up in: 12 months with [name]  You will receive a reminder letter in the mail two months in advance. If you don't receive a letter, please call our office to schedule the follow-up appointment.   Remote monitoring is used to monitor your Pacemaker or ICD from home. This monitoring reduces the number of office visits required to check your device to one time per year. It allows [place] to keep an eye on the functioning of your device to ensure it is working properly. You are scheduled for a device check from home on 11/07/13. You may send your transmission at any time that day. If you have a wireless device, the transmission will be sent automatically. After your physician reviews your transmission, you will receive a postcard with your next transmission date.

## 2013-08-08 NOTE — Progress Notes (Signed)
Eddie Hollingshead, MD: Primary Cardiologist: [name] is a 79 y.o. male with a h/o complete heart block sp PPM (MDT) by Dr Verlon Setting who presents today to establish care in the Electrophysiology device clinic.  He has done well since his pacemaker was implanted and remains active for his age.  He developed palpitations several months ago that did not correspond with sustained arrhythmias on device interrogation.  He was placed on Toprol and these have subsided.   Today, he  denies symptoms of palpitations, chest pain, shortness of breath, orthopnea, PND, lower extremity edema, dizziness, presyncope, syncope, or neurologic sequela.  The patientis tolerating medications without difficulties and is otherwise without complaint today.   Past Medical History  Diagnosis Date  . S/P placement of cardiac pacemaker 05/02/2013  . Palpitations 05/02/2013  . Hyperlipidemia   . History of colon cancer, stage III   . Essential hypertension, benign   . Malignant neoplasm of colon, unspecified site   . BPH (benign prostatic hyperplasia)   . Atrioventricular block, complete   . Cardiac pacemaker in situ   . Hematuria, unspecified   . Uveitis   . CAD (coronary artery disease) 05/02/2013    Prior cardiac cath (mild LAD disease of 30-40% stenosis, 75% proximal stenosis of diagonal branch and medically management). hISTORY OF PACEMAKER, NON-OBSTRUCTIVE CAD MEDICALLY STABLE.  . Glaucoma   . Ulcer     DU  . Palpitations    Past Surgical History  Procedure Laterality Date  . Cardiac catheterization      Prior cardiac cath (mild LAD disease of 30-40% stenosis, 75% proximal stenosis of diagonal branch. and medically management)  . Pacemaker insertion  11/2008    HEART BLOCK ,SYNCOPE  . Partial colectomy  04/2007  . Transurethral resection of prostate  2002    History   Social History  . Marital Status: Married    Spouse Name: N/A    Number of Children: N/A  . Years of Education: N/A    Occupational History  . Not on file.   Social History Main Topics  . Smoking status: Never Smoker   . Smokeless tobacco: Not on file  . Alcohol Use: No  . Drug Use: No  . Sexual Activity: Not on file   Other Topics Concern  . Not on file   Social History Narrative  . No narrative on file    Family History  Problem Relation Age of Onset  . Heart disease Mother   . CAD Mother   . Heart disease Father   . CAD Father   . Heart disease Brother   . CAD Brother   . Diabetes Sister   . Gout Brother   . Alcohol abuse Brother   . Liver disease Brother     Allergies  Allergen Reactions  . Levaquin [Levofloxacin In D5w] Other (See Comments)    DIZZINESS    Current Outpatient Prescriptions  Medication Sig Dispense Refill  . amLODipine (NORVASC) 5 MG tablet Take 5 mg by mouth daily.      Marland Kitchen aspirin 81 MG tablet Take 81 mg by mouth every other day.       . cholecalciferol (VITAMIN D) 1000 UNITS tablet Take 2,000 Units by mouth daily.      . dorzolamide-timolol (COSOPT) 22.3-6.8 MG/ML ophthalmic solution Place 1 drop into both eyes 2 (two) times daily.      . hydrochlorothiazide (HYDRODIURIL) 12.5 MG tablet Take 12.5 mg by mouth daily.      Marland Kitchen  metoprolol succinate (TOPROL XL) 25 MG 24 hr tablet Take 1 tablet (25 mg total) by mouth daily.  30 tablet  5  . Multiple Vitamins-Minerals (MULTIVITAMIN PO) Take 1 capsule by mouth every morning.       Marland Kitchen omeprazole (PRILOSEC) 20 MG capsule Take 20 mg by mouth daily.      . simvastatin (ZOCOR) 20 MG tablet Take 20 mg by mouth every evening.       No current facility-administered medications for this visit.    ROS- all systems are reviewed and negative except as per HPI  Physical Exam: Filed Vitals:   08/08/13 0923  BP: 138/78  Pulse: 72  Height: 5\' 11"  (1.803 m)  Weight: 171 lb (77.565 kg)    GEN- The patient is well appearing, alert and oriented x 3 today.   Head- normocephalic, atraumatic Eyes-  Sclera clear, conjunctiva  pink Ears- hearing intact Oropharynx- clear Neck- supple, no JVP Lymph- no cervical lymphadenopathy Lungs- Clear to ausculation bilaterally, normal work of breathing Chest- pacemaker pocket is well healed Heart- Regular rate and rhythm, no murmurs, rubs or gallops, PMI not laterally displaced GI- soft, NT, ND, + BS Extremities- no clubbing, cyanosis, or edema MS- no significant deformity or atrophy Skin- no rash or lesion Psych- euthymic mood, full affect Neuro- strength and sensation are intact  Pacemaker interrogation- reviewed in detail today,  See PACEART report ekg today reveals AV sequential pacing [place] records are reviewed  Assessment and Plan:  1. Complete heart block Normal pacemaker function See Pace Art report No changes today  2. htn Stable No change required today  3. Cad No ischemic symptoms No changes  [facility] Return to see [name] in the device clinic in 1 year Follow-up with Dr [name] as scheduled

## 2013-08-11 LAB — MDC_IDC_ENUM_SESS_TYPE_INCLINIC
Brady Statistic AP VS Percent: 0.1 %
Lead Channel Impedance Value: 536 Ohm
Lead Channel Pacing Threshold Amplitude: 0.75 V
Lead Channel Pacing Threshold Amplitude: 1 V
Lead Channel Pacing Threshold Pulse Width: 0.4 ms
Lead Channel Pacing Threshold Pulse Width: 0.4 ms
Lead Channel Setting Pacing Amplitude: 2 V
MDC IDC MSMT BATTERY VOLTAGE: 2.79 V
MDC IDC MSMT LEADCHNL RA IMPEDANCE VALUE: 444 Ohm
MDC IDC MSMT LEADCHNL RA SENSING INTR AMPL: 2.8 mV
MDC IDC SET LEADCHNL RV PACING AMPLITUDE: 2.5 V
MDC IDC SET LEADCHNL RV PACING PULSEWIDTH: 0.4 ms
MDC IDC STAT BRADY AP VP PERCENT: 83.6 %
MDC IDC STAT BRADY AS VP PERCENT: 16.4 %
MDC IDC STAT BRADY AS VS PERCENT: 0.1 %

## 2013-08-14 ENCOUNTER — Other Ambulatory Visit: Payer: Medicare Other | Admitting: Lab

## 2013-08-14 ENCOUNTER — Ambulatory Visit: Payer: Medicare Other | Admitting: Hematology & Oncology

## 2013-08-15 ENCOUNTER — Ambulatory Visit (HOSPITAL_BASED_OUTPATIENT_CLINIC_OR_DEPARTMENT_OTHER): Payer: Medicare Other | Admitting: Hematology & Oncology

## 2013-08-15 ENCOUNTER — Other Ambulatory Visit: Payer: Self-pay | Admitting: *Deleted

## 2013-08-15 ENCOUNTER — Other Ambulatory Visit (HOSPITAL_BASED_OUTPATIENT_CLINIC_OR_DEPARTMENT_OTHER): Payer: Medicare Other | Admitting: Lab

## 2013-08-15 ENCOUNTER — Encounter: Payer: Self-pay | Admitting: Hematology & Oncology

## 2013-08-15 VITALS — BP 130/69 | HR 63 | Temp 97.5°F | Resp 18 | Wt 177.0 lb

## 2013-08-15 DIAGNOSIS — Z85038 Personal history of other malignant neoplasm of large intestine: Secondary | ICD-10-CM

## 2013-08-15 LAB — COMPREHENSIVE METABOLIC PANEL
ALT: 15 U/L (ref 0–53)
AST: 22 U/L (ref 0–37)
Albumin: 3.9 g/dL (ref 3.5–5.2)
Alkaline Phosphatase: 81 U/L (ref 39–117)
BUN: 23 mg/dL (ref 6–23)
CO2: 27 meq/L (ref 19–32)
CREATININE: 1.01 mg/dL (ref 0.50–1.35)
Calcium: 9.4 mg/dL (ref 8.4–10.5)
Chloride: 106 mEq/L (ref 96–112)
GLUCOSE: 114 mg/dL — AB (ref 70–99)
POTASSIUM: 4 meq/L (ref 3.5–5.3)
Sodium: 144 mEq/L (ref 135–145)
Total Bilirubin: 0.7 mg/dL (ref 0.2–1.2)
Total Protein: 6.6 g/dL (ref 6.0–8.3)

## 2013-08-15 LAB — CBC WITH DIFFERENTIAL (CANCER CENTER ONLY)
BASO#: 0.1 10*3/uL (ref 0.0–0.2)
BASO%: 1.5 % (ref 0.0–2.0)
EOS ABS: 0.1 10*3/uL (ref 0.0–0.5)
EOS%: 1.9 % (ref 0.0–7.0)
HEMATOCRIT: 43.2 % (ref 38.7–49.9)
HEMOGLOBIN: 13.8 g/dL (ref 13.0–17.1)
LYMPH#: 1.5 10*3/uL (ref 0.9–3.3)
LYMPH%: 21.8 % (ref 14.0–48.0)
MCH: 24.4 pg — ABNORMAL LOW (ref 28.0–33.4)
MCHC: 31.9 g/dL — ABNORMAL LOW (ref 32.0–35.9)
MCV: 76 fL — AB (ref 82–98)
MONO#: 0.6 10*3/uL (ref 0.1–0.9)
MONO%: 9.3 % (ref 0.0–13.0)
NEUT#: 4.4 10*3/uL (ref 1.5–6.5)
NEUT%: 65.5 % (ref 40.0–80.0)
Platelets: 241 10*3/uL (ref 145–400)
RBC: 5.66 10*6/uL (ref 4.20–5.70)
RDW: 13.8 % (ref 11.1–15.7)
WBC: 6.8 10*3/uL (ref 4.0–10.0)

## 2013-08-15 LAB — CHCC SATELLITE - SMEAR

## 2013-08-15 LAB — CEA: CEA: 1.6 ng/mL (ref 0.0–5.0)

## 2013-08-15 NOTE — Progress Notes (Signed)
Hematology and Oncology Follow Up Visit  [name] [id number] 1924/05/31 80 y.o. 08/15/2013   Principle Diagnosis:  Stage IIIa (T1, N1, M0) adenocarcinoma of the sigmoid colon Current Therapy:    Observation     Interim History:  Mr.  Cannon is back for his yearly followup. He's doing okay. He's had no problems. There is no nausea vomiting. He's had no cough or shortness of breath. He's had no weakness or fatigue. He's had no bleeding or bruising.  Medications: Current outpatient prescriptions:amLODipine (NORVASC) 5 MG tablet, Take 5 mg by mouth daily., Disp: , Rfl: ;  aspirin 81 MG tablet, Take 81 mg by mouth every other day. , Disp: , Rfl: ;  cholecalciferol (VITAMIN D) 1000 UNITS tablet, Take 2,000 Units by mouth daily., Disp: , Rfl: ;  dorzolamide-timolol (COSOPT) 22.3-6.8 MG/ML ophthalmic solution, Place 1 drop into both eyes 2 (two) times daily., Disp: , Rfl:  hydrochlorothiazide (HYDRODIURIL) 12.5 MG tablet, Take 12.5 mg by mouth daily., Disp: , Rfl: ;  metoprolol succinate (TOPROL XL) 25 MG 24 hr tablet, Take 1 tablet (25 mg total) by mouth daily., Disp: 30 tablet, Rfl: 5;  Multiple Vitamins-Minerals (MULTIVITAMIN PO), Take 1 capsule by mouth every morning. , Disp: , Rfl: ;  omeprazole (PRILOSEC) 20 MG capsule, Take 20 mg by mouth daily., Disp: , Rfl:  simvastatin (ZOCOR) 20 MG tablet, Take 20 mg by mouth every evening., Disp: , Rfl:   Allergies:  Allergies  Allergen Reactions  . Levaquin [Levofloxacin In D5w] Other (See Comments)    DIZZINESS    Past Medical History, Surgical history, Social history, and Family History were reviewed and updated.  Review of Systems: As above  Physical Exam:  weight is 177 lb (80.287 kg). His temperature is 97.5 F (36.4 C). His blood pressure is 130/69 and his pulse is 63. His respiration is 18.   Well-developed well-nourished gentleman. Abdomen is soft. His well-healed laparoscopy scar. No fluid wave. The liver or spleen. Lungs are  clear. Cardiac exam regular in rhythm. Extremities unremarkable. Skin exam no rashes ecchymosis or petechia.  Lab Results  Component Value Date   WBC 6.8 08/15/2013   HGB 13.8 08/15/2013   HCT 43.2 08/15/2013   MCV 76* 08/15/2013   PLT 241 08/15/2013     Chemistry      Component Value Date/Time   NA 140 08/15/2012 0946   NA 143 09/22/2009 0918   K 4.1 08/15/2012 0946   K 4.0 09/22/2009 0918   CL 102 08/15/2012 0946   CL 104 09/22/2009 0918   CO2 29 08/15/2012 0946   CO2 30 09/22/2009 0918   BUN 19 08/15/2012 0946   BUN 22 09/22/2009 0918   CREATININE 1.10 08/15/2012 0946   CREATININE 1.1 09/22/2009 0918      Component Value Date/Time   CALCIUM 9.7 08/15/2012 0946   CALCIUM 9.6 09/22/2009 0918   ALKPHOS 77 08/15/2012 0946   ALKPHOS 128* 09/22/2009 0918   AST 25 08/15/2012 0946   AST 26 09/22/2009 0918   ALT 15 08/15/2012 0946   ALT 19 09/22/2009 0918   BILITOT 0.8 08/15/2012 0946   BILITOT 0.90 09/22/2009 0918         Impression and Plan: [name] is a residual abdomen. He had stage IIIa colon cancer. He's doing well. He is now over 5 years out.  We will see back yearly as we have been doing.   Volanda Napoleon, MD 3/19/201512:21 PM

## 2013-09-24 ENCOUNTER — Other Ambulatory Visit: Payer: Self-pay

## 2013-09-24 MED ORDER — HYDROCHLOROTHIAZIDE 12.5 MG PO TABS: 12.5000 mg | ORAL_TABLET | Freq: Every day | ORAL | Status: DC

## 2013-11-07 ENCOUNTER — Telehealth: Payer: Self-pay | Admitting: Cardiology

## 2013-11-07 ENCOUNTER — Ambulatory Visit (INDEPENDENT_AMBULATORY_CARE_PROVIDER_SITE_OTHER): Payer: Medicare Other | Admitting: *Deleted

## 2013-11-07 DIAGNOSIS — I442 Atrioventricular block, complete: Secondary | ICD-10-CM

## 2013-11-07 LAB — MDC_IDC_ENUM_SESS_TYPE_REMOTE
Battery Impedance: 374 Ohm
Brady Statistic AP VP Percent: 86 %
Brady Statistic AP VS Percent: 0 %
Brady Statistic AS VP Percent: 14 %
Brady Statistic AS VS Percent: 0 %
Date Time Interrogation Session: 20150612203507
Lead Channel Impedance Value: 439 Ohm
Lead Channel Impedance Value: 516 Ohm
Lead Channel Pacing Threshold Amplitude: 0.625 V
Lead Channel Pacing Threshold Amplitude: 0.75 V
Lead Channel Pacing Threshold Pulse Width: 0.4 ms
Lead Channel Pacing Threshold Pulse Width: 0.4 ms
Lead Channel Setting Pacing Amplitude: 2.5 V
Lead Channel Setting Pacing Pulse Width: 0.4 ms
MDC IDC MSMT BATTERY REMAINING LONGEVITY: 89 mo
MDC IDC MSMT BATTERY VOLTAGE: 2.79 V
MDC IDC SET LEADCHNL RA PACING AMPLITUDE: 1.5 V
MDC IDC SET LEADCHNL RV SENSING SENSITIVITY: 2 mV

## 2013-11-07 NOTE — Telephone Encounter (Signed)
Spoke with pt and reminded pt about remote transmission. Pt informed me that his machine was not working right. I gave him the customer support number to [employer] to call and receive help on fixing machine.

## 2013-11-08 ENCOUNTER — Telehealth: Payer: Self-pay | Admitting: Internal Medicine

## 2013-11-08 DIAGNOSIS — I442 Atrioventricular block, complete: Secondary | ICD-10-CM

## 2013-11-08 NOTE — Telephone Encounter (Signed)
Informed pt that transmission was received. Pt verbalized understanding.  

## 2013-11-08 NOTE — Progress Notes (Signed)
Remote pacemaker transmission.   

## 2013-11-08 NOTE — Telephone Encounter (Signed)
New message          Pt would like to know we received his transmission

## 2013-11-26 ENCOUNTER — Encounter: Payer: Self-pay | Admitting: Cardiology

## 2013-12-04 ENCOUNTER — Encounter: Payer: Self-pay | Admitting: Internal Medicine

## 2014-02-10 ENCOUNTER — Ambulatory Visit (INDEPENDENT_AMBULATORY_CARE_PROVIDER_SITE_OTHER): Payer: Medicare Other | Admitting: *Deleted

## 2014-02-10 DIAGNOSIS — I442 Atrioventricular block, complete: Secondary | ICD-10-CM

## 2014-02-10 NOTE — Progress Notes (Signed)
Remote pacemaker transmission.   

## 2014-02-14 LAB — MDC_IDC_ENUM_SESS_TYPE_REMOTE
Battery Remaining Longevity: 81 mo
Battery Voltage: 2.78 V
Brady Statistic AS VP Percent: 12 %
Lead Channel Impedance Value: 450 Ohm
Lead Channel Impedance Value: 521 Ohm
Lead Channel Pacing Threshold Amplitude: 0.625 V
Lead Channel Setting Pacing Amplitude: 2 V
Lead Channel Setting Pacing Pulse Width: 0.4 ms
Lead Channel Setting Sensing Sensitivity: 2 mV
MDC IDC MSMT BATTERY IMPEDANCE: 447 Ohm
MDC IDC MSMT LEADCHNL RA PACING THRESHOLD AMPLITUDE: 1 V
MDC IDC MSMT LEADCHNL RA PACING THRESHOLD PULSEWIDTH: 0.4 ms
MDC IDC MSMT LEADCHNL RV PACING THRESHOLD PULSEWIDTH: 0.4 ms
MDC IDC SESS DTM: 20150914123851
MDC IDC SET LEADCHNL RV PACING AMPLITUDE: 2.5 V
MDC IDC STAT BRADY AP VP PERCENT: 88 %
MDC IDC STAT BRADY AP VS PERCENT: 0 %
MDC IDC STAT BRADY AS VS PERCENT: 0 %

## 2014-02-18 ENCOUNTER — Encounter: Payer: Self-pay | Admitting: Cardiology

## 2014-02-20 ENCOUNTER — Encounter: Payer: Self-pay | Admitting: Internal Medicine

## 2014-03-25 ENCOUNTER — Ambulatory Visit (HOSPITAL_BASED_OUTPATIENT_CLINIC_OR_DEPARTMENT_OTHER): Payer: Medicare Other

## 2014-03-25 DIAGNOSIS — Z85038 Personal history of other malignant neoplasm of large intestine: Secondary | ICD-10-CM

## 2014-03-25 DIAGNOSIS — Z23 Encounter for immunization: Secondary | ICD-10-CM

## 2014-03-25 MED ORDER — INFLUENZA VAC SPLIT QUAD 0.5 ML IM SUSY
0.5000 mL | PREFILLED_SYRINGE | Freq: Once | INTRAMUSCULAR | Status: AC
  Administered 2014-03-25: 0.5 mL via INTRAMUSCULAR
  Filled 2014-03-25: qty 0.5

## 2014-03-25 NOTE — Patient Instructions (Signed)

## 2014-04-29 ENCOUNTER — Other Ambulatory Visit: Payer: Self-pay

## 2014-04-29 MED ORDER — HYDROCHLOROTHIAZIDE 12.5 MG PO TABS: 12.5000 mg | ORAL_TABLET | Freq: Every day | ORAL | Status: DC

## 2014-04-30 ENCOUNTER — Other Ambulatory Visit: Payer: Self-pay | Admitting: Cardiology

## 2014-05-02 ENCOUNTER — Encounter: Payer: Self-pay | Admitting: Cardiology

## 2014-05-02 ENCOUNTER — Ambulatory Visit (INDEPENDENT_AMBULATORY_CARE_PROVIDER_SITE_OTHER): Payer: Medicare Other | Admitting: Cardiology

## 2014-05-02 VITALS — BP 130/70 | HR 77 | Ht 71.0 in | Wt 164.8 lb

## 2014-05-02 DIAGNOSIS — R002 Palpitations: Secondary | ICD-10-CM

## 2014-05-02 DIAGNOSIS — I2510 Atherosclerotic heart disease of native coronary artery without angina pectoris: Secondary | ICD-10-CM

## 2014-05-02 DIAGNOSIS — E785 Hyperlipidemia, unspecified: Secondary | ICD-10-CM

## 2014-05-02 DIAGNOSIS — Z950 Presence of cardiac pacemaker: Secondary | ICD-10-CM

## 2014-05-02 DIAGNOSIS — I2583 Coronary atherosclerosis due to lipid rich plaque: Secondary | ICD-10-CM

## 2014-05-02 NOTE — Progress Notes (Signed)
[place], [address] [place], [place]  [address] Phone: [phone] Fax:  [phone]  Date:  05/02/2014   ID:  [name], DOB 1924/09/11, MRN [id number]  PCP:  [name], MD   History of Present Illness: [name] is a 80 y.o. male with hx of pacemaker placed in July 2010 due to heart block. Prior cardiac cath (mild LAD disease of 30-40% stenosis, 75% proximal stenosis of diagonal branch. and medically management) Echocardiogram with normal EF.   Previously he was feeling heart fluttering at approximately 2 AM. Toprol-XL 25 mg has helped out significantly. No recent arrhythmias on pacemaker interrogation. Today he states the flutterying resolved and he is sleeping better. He feels the medication helped him feel alot better. He denies any complaints..  Patient denies chest pain, dizziness, syncope, swelling, SOB, nor PND.    Wt Readings from Last 3 Encounters:  08/15/13 177 lb (80.287 kg)  08/08/13 171 lb (77.565 kg)  05/02/13 170 lb 12.8 oz (77.474 kg)     Past Medical History  Diagnosis Date  . Hyperlipidemia   . History of colon cancer, stage III   . Essential hypertension, benign   . Malignant neoplasm of colon, unspecified site   . BPH (benign prostatic hyperplasia)   . Atrioventricular block, complete   . Cardiac pacemaker in situ   . Hematuria, unspecified   . Uveitis   . CAD (coronary artery disease) 05/02/2013    Prior cardiac cath (mild LAD disease of 30-40% stenosis, 75% proximal stenosis of diagonal branch and medically management). hISTORY OF PACEMAKER, NON-OBSTRUCTIVE CAD MEDICALLY STABLE.  . Glaucoma   . Ulcer     DU    Past Surgical History  Procedure Laterality Date  . Cardiac catheterization      Prior cardiac cath (mild LAD disease of 30-40% stenosis, 75% proximal stenosis of diagonal branch. and medically management)  . Pacemaker insertion  11/2008    MDT Adapta L implanted for Complete heart block by Dr Verlon Setting  .  Partial colectomy  04/2007  . Transurethral resection of prostate  2002    Current Outpatient Prescriptions  Medication Sig Dispense Refill  . amLODipine (NORVASC) 5 MG tablet Take 5 mg by mouth daily.    Marland Kitchen aspirin 81 MG tablet Take 81 mg by mouth every other day.     . cholecalciferol (VITAMIN D) 1000 UNITS tablet Take 2,000 Units by mouth daily.    . dorzolamide-timolol (COSOPT) 22.3-6.8 MG/ML ophthalmic solution Place 1 drop into both eyes 2 (two) times daily.    . hydrochlorothiazide (HYDRODIURIL) 12.5 MG tablet Take 1 tablet (12.5 mg total) by mouth daily. 30 tablet 6  . metoprolol succinate (TOPROL XL) 25 MG 24 hr tablet Take 1 tablet (25 mg total) by mouth daily. 30 tablet 5  . Multiple Vitamins-Minerals (MULTIVITAMIN PO) Take 1 capsule by mouth every morning.     Marland Kitchen omeprazole (PRILOSEC) 20 MG capsule Take 20 mg by mouth daily.    . simvastatin (ZOCOR) 20 MG tablet Take 20 mg by mouth every evening.     No current facility-administered medications for this visit.    Allergies:    Allergies  Allergen Reactions  . Levaquin [Levofloxacin In D5w] Other (See Comments)    DIZZINESS    Social History:  The patient  reports that he has never smoked. He does not have any smokeless tobacco history on file. He reports that he does not drink alcohol or  use illicit drugs.   ROS:  Please see the history of present illness.   No syncope, no CP, no SOB. Great exercise tolerance.     PHYSICAL EXAM: VS:  There were no vitals taken for this visit. Thin, well developed, in no acute distress HEENT: normal Neck: no JVD Cardiac:  normal S1, S2; RRR; no murmurPacemaker Lungs:  clear to auscultation bilaterally, no wheezing, rhonchi or rales Abd: soft, nontender, no hepatomegaly Ext: no edema Skin: warm and dry Neuro: no focal abnormalities noted  EKG:  Pacemaker interrogation normal   ASSESSMENT AND PLAN:  1. Pacemaker-functioning well. No changes. 2. Palpitations-improved on  metoprolol. Continue. 3. Hyperlipidemia-simvastatin, low-dose. He is having blood work done by Dr. Lysle Rubens. Labs ok. 4. Coronary artery disease-continue with aggressive secondary prevention. Medical management.No active angina. 5. We will see him back in one year.  Signed, Candee Furbish, MD [facility]  05/02/2014 9:50 AM

## 2014-05-02 NOTE — Patient Instructions (Signed)
The current medical regimen is effective;  continue present plan and medications.  Follow up in 1 year with Dr Skains.  You will receive a letter in the mail 2 months before you are due.  Please call us when you receive this letter to schedule your follow up appointment.  

## 2014-05-14 ENCOUNTER — Ambulatory Visit (INDEPENDENT_AMBULATORY_CARE_PROVIDER_SITE_OTHER): Payer: Medicare Other | Admitting: *Deleted

## 2014-05-14 ENCOUNTER — Encounter: Payer: Self-pay | Admitting: Internal Medicine

## 2014-05-14 DIAGNOSIS — I442 Atrioventricular block, complete: Secondary | ICD-10-CM

## 2014-05-14 LAB — MDC_IDC_ENUM_SESS_TYPE_REMOTE
Brady Statistic AS VS Percent: 0 %
Date Time Interrogation Session: 20151216144059
Lead Channel Impedance Value: 451 Ohm
Lead Channel Impedance Value: 546 Ohm
Lead Channel Pacing Threshold Amplitude: 0.625 V
Lead Channel Pacing Threshold Pulse Width: 0.4 ms
Lead Channel Pacing Threshold Pulse Width: 0.4 ms
Lead Channel Setting Pacing Pulse Width: 0.4 ms
MDC IDC MSMT BATTERY IMPEDANCE: 472 Ohm
MDC IDC MSMT BATTERY REMAINING LONGEVITY: 79 mo
MDC IDC MSMT BATTERY VOLTAGE: 2.78 V
MDC IDC MSMT LEADCHNL RA PACING THRESHOLD AMPLITUDE: 1 V
MDC IDC SET LEADCHNL RA PACING AMPLITUDE: 2 V
MDC IDC SET LEADCHNL RV PACING AMPLITUDE: 2.5 V
MDC IDC SET LEADCHNL RV SENSING SENSITIVITY: 2 mV
MDC IDC STAT BRADY AP VP PERCENT: 89 %
MDC IDC STAT BRADY AP VS PERCENT: 0 %
MDC IDC STAT BRADY AS VP PERCENT: 11 %

## 2014-05-14 NOTE — Progress Notes (Signed)
Remote pacemaker transmission.   

## 2014-06-05 ENCOUNTER — Encounter: Payer: Self-pay | Admitting: Cardiology

## 2014-07-30 ENCOUNTER — Telehealth: Payer: Self-pay | Admitting: Internal Medicine

## 2014-07-30 NOTE — Telephone Encounter (Signed)
Pt daughter stated that shed need pt serial number to upgrade monitor. Provided pt daughter w/ this information.

## 2014-07-30 NOTE — Telephone Encounter (Signed)
New message       Pt received a letter from [facility] needing to change out his monitor.  She want to talk to someone in the device clinic

## 2014-08-09 ENCOUNTER — Encounter: Payer: Self-pay | Admitting: Nurse Practitioner

## 2014-08-09 NOTE — Progress Notes (Signed)
Electrophysiology Office Note Date: 08/11/2014  ID:  [name], DOB March 09, 1925, MRN [id number]  PCP: [name], MD Primary Cardiologist: [name] Electrophysiologist: [name]  CC: Pacemaker follow-up  [name] is a 81 y.o. male seen today for Dr [name].  He presents today for routine electrophysiology followup.  Since last being seen in our clinic, the patient reports doing very well.  He denies chest pain, palpitations, dyspnea, nausea, vomiting, or syncope.  He has previously had symptomatic palpitations but these have improved with Toprol. He has had rare orthostatic intolerance.  He remains very active at home and has no functional limitations with activity.   Device History: MDT dual chamber PPM implanted 2010 for complete heart block by Dr Verlon Setting, now followed by Dr [name].    Past Medical History  Diagnosis Date  . Hyperlipidemia   . History of colon cancer, stage III   . Essential hypertension, benign   . BPH (benign prostatic hyperplasia)   . Atrioventricular block, complete     a. s/p MDT ADDRL1 pacemaker implanted by Dr Verlon Setting  . Hematuria, unspecified   . Uveitis   . CAD (coronary artery disease) 05/02/2013    a. Prior cardiac cath (mild LAD disease of 30-40% stenosis, 75% proximal stenosis of diagonal branch and medically management). hISTORY OF PACEMAKER, NON-OBSTRUCTIVE CAD MEDICALLY STABLE.  . Glaucoma   . Ulcer     DU   Past Surgical History  Procedure Laterality Date  . Cardiac catheterization      Prior cardiac cath (mild LAD disease of 30-40% stenosis, 75% proximal stenosis of diagonal branch. and medically management)  . Pacemaker insertion  11/2008    MDT Adapta L implanted for Complete heart block by Dr Verlon Setting  . Partial colectomy  04/2007  . Transurethral resection of prostate  2002    Current Outpatient Prescriptions  Medication Sig Dispense Refill  . amLODipine (NORVASC) 5 MG tablet Take 5 mg by mouth daily.    Marland Kitchen aspirin  81 MG tablet Take 81 mg by mouth every other day.     . cholecalciferol (VITAMIN D) 1000 UNITS tablet Take 2,000 Units by mouth daily.    . hydrochlorothiazide (HYDRODIURIL) 12.5 MG tablet Take 1 tablet (12.5 mg total) by mouth daily. 30 tablet 6  . metoprolol succinate (TOPROL XL) 25 MG 24 hr tablet Take 1 tablet (25 mg total) by mouth daily. 30 tablet 5  . Multiple Vitamins-Minerals (MULTIVITAMIN PO) Take 1 capsule by mouth every morning.     Marland Kitchen omeprazole (PRILOSEC) 20 MG capsule Take 20 mg by mouth daily.    . simvastatin (ZOCOR) 20 MG tablet Take 20 mg by mouth every evening.     No current facility-administered medications for this visit.    Allergies:   Levaquin   Social History: History   Social History  . Marital Status: Married    Spouse Name: N/A  . Number of Children: N/A  . Years of Education: N/A   Occupational History  . Not on file.   Social History Main Topics  . Smoking status: Never Smoker   . Smokeless tobacco: Not on file  . Alcohol Use: No  . Drug Use: No  . Sexual Activity: Not on file   Other Topics Concern  . Not on file   Social History Narrative    Family History: Family History  Problem Relation Age of Onset  . Heart disease Mother   . CAD Mother   . Heart disease Father   .  CAD Father   . Heart disease Brother   . CAD Brother   . Diabetes Sister   . Gout Brother   . Alcohol abuse Brother   . Liver disease Brother      Review of Systems: General: No chills, fever, night sweats or weight changes  Cardiovascular:  No chest pain, dyspnea on exertion, edema, orthopnea, palpitations, paroxysmal nocturnal dyspnea  Dermatological: No rash, lesions or masses Respiratory: No cough, dyspnea Urologic: No hematuria, dysuria Abdominal: No nausea, vomiting, diarrhea, bright red blood per rectum, melena, or hematemesis Neurologic: No visual changes, weakness, changes in mental status All other systems reviewed and are otherwise negative except  as noted above.   Physical Exam: VS:  BP 110/58 mmHg  Pulse 63  Ht 5\' 10"  (1.778 m)  Wt 166 lb 12.8 oz (75.66 kg)  BMI 23.93 kg/m2 , BMI Body mass index is 23.93 kg/(m^2).  GEN- The patient is elderly appearing, alert and oriented x 3 today.   HEENT: normocephalic, atraumatic; sclera clear, conjunctiva pink; hearing intact; oropharynx clear; neck supple Lungs- Clear to ausculation bilaterally, normal work of breathing.  No wheezes, rales, rhonchi Heart- Regular rate and rhythm (paced) GI- soft, non-tender, non-distended, bowel sounds present Extremities- no clubbing, cyanosis, or edema; DP/PT/radial pulses 1+ bilaterally MS- no significant deformity or atrophy Skin- warm and dry, no rash or lesion; PPM pocket well healed Psych- euthymic mood, full affect Neuro- strength and sensation are intact  PPM Interrogation- reviewed in detail today,  See PACEART report  EKG:  EKG is ordered today. The ekg ordered today shows AV pacing  Recent Labs: 08/15/2013: ALT 15; BUN 23; Creatinine 1.01; Hemoglobin 13.8; Platelets 241; Potassium 4.0; Sodium 144   Wt Readings from Last 3 Encounters:  08/11/14 166 lb 12.8 oz (75.66 kg)  05/02/14 164 lb 12 oz (74.73 kg)  08/15/13 177 lb (80.287 kg)     Other studies Reviewed: Additional studies/ records that were reviewed today include: Dr [name] and Dr [name]' prior office notes   Assessment and Plan:  1.  Complete heart block Normal PPM function - patient is device dependent today  See Claudia Desanctis Art report No changes today Echocardiogram 2010 prior to device implant demonstrated normal LV function.   2.  Palpitations Resolved on Toprol 2 short runs NSVT seen on pacemaker interrogation today - these have been asymptomatic.   3.  Hypertension Stable No change required today Pt advised if orthostatic intolerance becomes more of an issue, he could try trial off of HCTZ and follow BP's. BMET today  4.  CAD No recent ischemic  symptoms   Current medicines are reviewed at length with the patient today.   The patient does not have concerns regarding his medicines.  The following changes were made today:  none  Labs/ tests ordered today include:  Orders Placed This Encounter  Procedures  . Basic metabolic panel  . Implantable device check  . EKG 12-Lead     Disposition:   FU with Dr [name] 6 months.  Carelink every 3 months, follow up with Dr [name] 1 year.    Signed, Chanetta Marshall, NP 08/11/2014 10:23 AM  [place] [address] [phone] (office) [phone] (fax

## 2014-08-11 ENCOUNTER — Ambulatory Visit (INDEPENDENT_AMBULATORY_CARE_PROVIDER_SITE_OTHER): Payer: Medicare Other | Admitting: Nurse Practitioner

## 2014-08-11 ENCOUNTER — Encounter: Payer: Self-pay | Admitting: Internal Medicine

## 2014-08-11 ENCOUNTER — Encounter: Payer: Self-pay | Admitting: Nurse Practitioner

## 2014-08-11 VITALS — BP 110/58 | HR 63 | Ht 70.0 in | Wt 166.8 lb

## 2014-08-11 DIAGNOSIS — I10 Essential (primary) hypertension: Secondary | ICD-10-CM

## 2014-08-11 DIAGNOSIS — I442 Atrioventricular block, complete: Secondary | ICD-10-CM

## 2014-08-11 DIAGNOSIS — Z950 Presence of cardiac pacemaker: Secondary | ICD-10-CM

## 2014-08-11 LAB — MDC_IDC_ENUM_SESS_TYPE_INCLINIC
Battery Voltage: 2.78 V
Brady Statistic AP VP Percent: 90 %
Brady Statistic AP VS Percent: 0 %
Brady Statistic AS VS Percent: 0 %
Date Time Interrogation Session: 20160314102202
Lead Channel Impedance Value: 439 Ohm
Lead Channel Impedance Value: 510 Ohm
Lead Channel Pacing Threshold Amplitude: 1 V
Lead Channel Pacing Threshold Pulse Width: 0.4 ms
Lead Channel Pacing Threshold Pulse Width: 0.4 ms
Lead Channel Sensing Intrinsic Amplitude: 4 mV
Lead Channel Setting Pacing Amplitude: 2 V
Lead Channel Setting Pacing Amplitude: 2.5 V
Lead Channel Setting Pacing Pulse Width: 0.4 ms
Lead Channel Setting Sensing Sensitivity: 2 mV
MDC IDC MSMT BATTERY IMPEDANCE: 521 Ohm
MDC IDC MSMT BATTERY REMAINING LONGEVITY: 76 mo
MDC IDC MSMT LEADCHNL RV PACING THRESHOLD AMPLITUDE: 0.5 V
MDC IDC STAT BRADY AS VP PERCENT: 10 %

## 2014-08-11 LAB — BASIC METABOLIC PANEL
BUN: 25 mg/dL — ABNORMAL HIGH (ref 6–23)
CO2: 34 mEq/L — ABNORMAL HIGH (ref 19–32)
CREATININE: 1.13 mg/dL (ref 0.40–1.50)
Calcium: 9.6 mg/dL (ref 8.4–10.5)
Chloride: 102 mEq/L (ref 96–112)
GFR: 78.84 mL/min (ref >60.00)
Glucose, Bld: 63 mg/dL — ABNORMAL LOW (ref 70–99)
Potassium: 3.5 mEq/L (ref 3.5–5.1)
Sodium: 139 mEq/L (ref 135–145)

## 2014-08-11 NOTE — Patient Instructions (Signed)
Your physician recommends that you continue on your current medications as directed. Please refer to the Current Medication list given to you today.  Lab Today: Bmet  Remote monitoring is used to monitor your Pacemaker of ICD from home. This monitoring reduces the number of office visits required to check your device to one time per year. It allows [place] to keep an eye on the functioning of your device to ensure it is working properly. You are scheduled for a device check from home on 11/10/14. You may send your transmission at any time that day. If you have a wireless device, the transmission will be sent automatically. After your physician reviews your transmission, you will receive a postcard with your next transmission date.  Your physician wants you to follow-up in: 6 months with Dr.Skains You will receive a reminder letter in the mail two months in advance. If you don't receive a letter, please call our office to schedule the follow-up appointment.  Your physician wants you to follow-up in: 1 year with Dr.[name] You will receive a reminder letter in the mail two months in advance. If you don't receive a letter, please call our office to schedule the follow-up appointment.

## 2014-08-12 ENCOUNTER — Telehealth: Payer: Self-pay | Admitting: *Deleted

## 2014-08-12 NOTE — Telephone Encounter (Signed)
-----   Message from Chanetta Marshall, NP sent at 08/11/2014  7:43 PM EDT ----- Please call patient, will slightly increased BUN/creat and orthostatic intolerance, discontinue HCTZ and follow blood pressures.

## 2014-08-12 NOTE — Telephone Encounter (Signed)
Pt will stop HCTZ/ MAR updated. Pt will check BP for 2 weeks am/pm and will call with results.

## 2014-08-14 ENCOUNTER — Other Ambulatory Visit (HOSPITAL_BASED_OUTPATIENT_CLINIC_OR_DEPARTMENT_OTHER): Payer: Medicare Other | Admitting: Lab

## 2014-08-14 ENCOUNTER — Ambulatory Visit (HOSPITAL_BASED_OUTPATIENT_CLINIC_OR_DEPARTMENT_OTHER): Payer: Medicare Other | Admitting: Hematology & Oncology

## 2014-08-14 VITALS — BP 140/74 | HR 80 | Temp 97.8°F | Resp 16 | Ht 70.0 in | Wt 167.0 lb

## 2014-08-14 DIAGNOSIS — Z85038 Personal history of other malignant neoplasm of large intestine: Secondary | ICD-10-CM

## 2014-08-14 LAB — CBC WITH DIFFERENTIAL (CANCER CENTER ONLY)
BASO#: 0.1 10*3/uL (ref 0.0–0.2)
BASO%: 2.4 % — ABNORMAL HIGH (ref 0.0–2.0)
EOS ABS: 0.3 10*3/uL (ref 0.0–0.5)
EOS%: 5 % (ref 0.0–7.0)
HCT: 40.8 % (ref 38.7–49.9)
HEMOGLOBIN: 13.2 g/dL (ref 13.0–17.1)
LYMPH#: 2 10*3/uL (ref 0.9–3.3)
LYMPH%: 40.5 % (ref 14.0–48.0)
MCH: 24.3 pg — ABNORMAL LOW (ref 28.0–33.4)
MCHC: 32.4 g/dL (ref 32.0–35.9)
MCV: 75 fL — ABNORMAL LOW (ref 82–98)
MONO#: 0.5 10*3/uL (ref 0.1–0.9)
MONO%: 10.5 % (ref 0.0–13.0)
NEUT%: 41.6 % (ref 40.0–80.0)
NEUTROS ABS: 2.1 10*3/uL (ref 1.5–6.5)
Platelets: 243 10*3/uL (ref 145–400)
RBC: 5.43 10*6/uL (ref 4.20–5.70)
RDW: 13.4 % (ref 11.1–15.7)
WBC: 5 10*3/uL (ref 4.0–10.0)

## 2014-08-14 NOTE — Progress Notes (Signed)
Hematology and Oncology Follow Up Visit  [name] [id number] Aug 23, 1924 81 y.o. 08/14/2014   Principle Diagnosis:  Stage IIIa (T1, N1, M0) adenocarcinoma of the sigmoid colon Current Therapy:    Observation     Interim History:  Mr.  Cannon is back for his yearly followup. He's doing okay. I see him prematurely mother so. He comes in with his wife who has metastatic breast cancer. Thankfully, she is doing pretty well.  He's had no problems. There is no nausea or vomiting. He's had no cough or shortness of breath. He's had no weakness or fatigue. He's had no bleeding or bruising. There's been no change in bowel or bladder habits. He has had no rashes. He's had no leg swelling. He has had no fever. There's been no headache.  Medications:  Current outpatient prescriptions:  .  amLODipine (NORVASC) 5 MG tablet, Take 5 mg by mouth daily., Disp: , Rfl:  .  aspirin 81 MG tablet, Take 81 mg by mouth every other day. , Disp: , Rfl:  .  cholecalciferol (VITAMIN D) 1000 UNITS tablet, Take 2,000 Units by mouth daily., Disp: , Rfl:  .  metoprolol succinate (TOPROL XL) 25 MG 24 hr tablet, Take 1 tablet (25 mg total) by mouth daily., Disp: 30 tablet, Rfl: 5 .  Multiple Vitamins-Minerals (MULTIVITAMIN PO), Take 1 capsule by mouth every morning. , Disp: , Rfl:  .  omeprazole (PRILOSEC) 20 MG capsule, Take 20 mg by mouth daily., Disp: , Rfl:  .  simvastatin (ZOCOR) 20 MG tablet, Take 20 mg by mouth every evening., Disp: , Rfl:  .  hydrochlorothiazide (HYDRODIURIL) 12.5 MG tablet, , Disp: , Rfl: 6  Allergies:  Allergies  Allergen Reactions  . Levaquin [Levofloxacin In D5w] Other (See Comments)    DIZZINESS    Past Medical History, Surgical history, Social history, and Family History were reviewed and updated.  Review of Systems: As above  Physical Exam:  height is 5\' 10"  (1.778 m) and weight is 167 lb (75.751 kg). His temperature is 97.8 F (36.6 C). His blood pressure is 140/74 and  his pulse is 80. His respiration is 16.   Well-developed well-nourished African-American gentleman. Head and neck exam shows no ocular or oral lesions. He has no adenopathy in his neck. Thyroid is non-palpable. Abdomen is soft. His well-healed laparoscopy scar. No fluid wave. The liver or spleen. Lungs are clear. Cardiac exam regular rate and rhythm with no murmurs, rubs or bruits.. Extremities show no clubbing, cyanosis or edema. He has age related osteoarthritic changes.. Skin exam no rashes ecchymosis or petechia. Neurological exam shows no focal deficits.  Lab Results  Component Value Date   WBC 5.0 08/14/2014   HGB 13.2 08/14/2014   HCT 40.8 08/14/2014   MCV 75* 08/14/2014   PLT 243 08/14/2014     Chemistry      Component Value Date/Time   NA 139 08/11/2014 1042   NA 143 09/22/2009 0918   K 3.5 08/11/2014 1042   K 4.0 09/22/2009 0918   CL 102 08/11/2014 1042   CL 104 09/22/2009 0918   CO2 34* 08/11/2014 1042   CO2 30 09/22/2009 0918   BUN 25* 08/11/2014 1042   BUN 22 09/22/2009 0918   CREATININE 1.13 08/11/2014 1042   CREATININE 1.1 09/22/2009 0918      Component Value Date/Time   CALCIUM 9.6 08/11/2014 1042   CALCIUM 9.6 09/22/2009 0918   ALKPHOS 81 08/15/2013 1131   ALKPHOS 128* 09/22/2009 0931  AST 22 08/15/2013 1131   AST 26 09/22/2009 0918   ALT 15 08/15/2013 1131   ALT 19 09/22/2009 0918   BILITOT 0.7 08/15/2013 1131   BILITOT 0.90 09/22/2009 0918         Impression and Plan: [name] is an 81 year old African-American male.Marland Kitchen He had stage IIIa colon cancer. He's doing well. He is now over 6 years out.  I see no evidence of recurrent disease. He is doing remarkably well.  We will see back yearly as we have been doing.   Volanda Napoleon, MD 3/17/20169:11 AM

## 2014-08-15 ENCOUNTER — Telehealth: Payer: Self-pay | Admitting: *Deleted

## 2014-08-15 LAB — COMPREHENSIVE METABOLIC PANEL
ALT: 16 U/L (ref 0–53)
AST: 24 U/L (ref 0–37)
Albumin: 3.8 g/dL (ref 3.5–5.2)
Alkaline Phosphatase: 78 U/L (ref 39–117)
BUN: 26 mg/dL — AB (ref 6–23)
CALCIUM: 9.5 mg/dL (ref 8.4–10.5)
CO2: 24 meq/L (ref 19–32)
Chloride: 104 mEq/L (ref 96–112)
Creatinine, Ser: 1.08 mg/dL (ref 0.50–1.35)
GLUCOSE: 156 mg/dL — AB (ref 70–99)
Potassium: 3.5 mEq/L (ref 3.5–5.3)
Sodium: 141 mEq/L (ref 135–145)
TOTAL PROTEIN: 6.6 g/dL (ref 6.0–8.3)
Total Bilirubin: 0.6 mg/dL (ref 0.2–1.2)

## 2014-08-15 LAB — CEA: CEA: 1.7 ng/mL (ref 0.0–5.0)

## 2014-08-15 NOTE — Telephone Encounter (Signed)
Called patient to let him know blood sugar a little high .  Labs faxed to patient primary care physician dr. Lorenda Hatchet

## 2014-08-15 NOTE — Telephone Encounter (Signed)
-----   Message from Volanda Napoleon, MD sent at 08/15/2014  7:13 AM EDT ----- Call - labs look ok!!  Blood sugar is a little high!!  pete

## 2014-10-03 ENCOUNTER — Telehealth: Payer: Self-pay | Admitting: Internal Medicine

## 2014-10-03 NOTE — Telephone Encounter (Signed)
Pt was attempting to connect his carelink. When doing so, he pulled his phone cord out of the jack thereby disconnecting himself.   I gave pt instructions to connect his [place]. Practice transmission attempted but initially not successful due to wirex inable to detect reception. Pt will attempt to retransmit next to a window. Pt aware he may transmit from anywhere including friend or family member's residence.  Pt aware of pacer remote 11/10/14.

## 2014-10-03 NOTE — Telephone Encounter (Signed)
New message      Pt was on the phone with the pacemaker people and got cut off.  I think it was carelink.  Do you have the phone number?

## 2014-10-20 ENCOUNTER — Telehealth: Payer: Self-pay | Admitting: Internal Medicine

## 2014-10-20 NOTE — Telephone Encounter (Signed)
Informed pt daughter that transmission was received.

## 2014-10-20 NOTE — Telephone Encounter (Signed)
°  1. Has your device fired? No  2. Is you device beeping? No  3. Are you experiencing draining or swelling at device site? No  4. Are you calling to see if we received your device transmission? Yes  5. Have you passed out? No   Pt's daughter calling to find out if we received a transmission from the pt yesterday. Please call back and advise.

## 2014-11-10 ENCOUNTER — Ambulatory Visit (INDEPENDENT_AMBULATORY_CARE_PROVIDER_SITE_OTHER): Payer: Medicare Other | Admitting: *Deleted

## 2014-11-10 DIAGNOSIS — I442 Atrioventricular block, complete: Secondary | ICD-10-CM | POA: Diagnosis not present

## 2014-11-10 NOTE — Progress Notes (Signed)
Remote pacemaker transmission.   

## 2014-11-12 LAB — CUP PACEART REMOTE DEVICE CHECK
Battery Voltage: 2.78 V
Brady Statistic AP VS Percent: 0 %
Brady Statistic AS VP Percent: 6 %
Brady Statistic AS VS Percent: 0 %
Date Time Interrogation Session: 20160613120415
Lead Channel Impedance Value: 439 Ohm
Lead Channel Impedance Value: 486 Ohm
Lead Channel Pacing Threshold Amplitude: 0.625 V
Lead Channel Pacing Threshold Amplitude: 0.875 V
Lead Channel Pacing Threshold Pulse Width: 0.4 ms
Lead Channel Pacing Threshold Pulse Width: 0.4 ms
Lead Channel Setting Pacing Amplitude: 2 V
MDC IDC MSMT BATTERY IMPEDANCE: 571 Ohm
MDC IDC MSMT BATTERY REMAINING LONGEVITY: 71 mo
MDC IDC SET LEADCHNL RV PACING AMPLITUDE: 2.5 V
MDC IDC SET LEADCHNL RV PACING PULSEWIDTH: 0.4 ms
MDC IDC SET LEADCHNL RV SENSING SENSITIVITY: 2 mV
MDC IDC STAT BRADY AP VP PERCENT: 94 %

## 2014-11-17 ENCOUNTER — Encounter: Payer: Self-pay | Admitting: Cardiology

## 2014-11-21 ENCOUNTER — Encounter: Payer: Self-pay | Admitting: Internal Medicine

## 2015-02-09 ENCOUNTER — Ambulatory Visit (INDEPENDENT_AMBULATORY_CARE_PROVIDER_SITE_OTHER): Payer: Medicare Other | Admitting: *Deleted

## 2015-02-09 DIAGNOSIS — I442 Atrioventricular block, complete: Secondary | ICD-10-CM

## 2015-02-09 NOTE — Progress Notes (Signed)
Remote pacemaker transmission.   

## 2015-02-13 ENCOUNTER — Encounter: Payer: Self-pay | Admitting: Cardiology

## 2015-02-13 ENCOUNTER — Ambulatory Visit (INDEPENDENT_AMBULATORY_CARE_PROVIDER_SITE_OTHER): Payer: Medicare Other | Admitting: Cardiology

## 2015-02-13 VITALS — BP 130/72 | HR 74 | Ht 70.0 in | Wt 164.8 lb

## 2015-02-13 DIAGNOSIS — I2583 Coronary atherosclerosis due to lipid rich plaque: Secondary | ICD-10-CM

## 2015-02-13 DIAGNOSIS — Z950 Presence of cardiac pacemaker: Secondary | ICD-10-CM | POA: Diagnosis not present

## 2015-02-13 DIAGNOSIS — I442 Atrioventricular block, complete: Secondary | ICD-10-CM | POA: Diagnosis not present

## 2015-02-13 DIAGNOSIS — I2510 Atherosclerotic heart disease of native coronary artery without angina pectoris: Secondary | ICD-10-CM | POA: Diagnosis not present

## 2015-02-13 DIAGNOSIS — E785 Hyperlipidemia, unspecified: Secondary | ICD-10-CM | POA: Diagnosis not present

## 2015-02-13 NOTE — Progress Notes (Signed)
[name]. [address], [address] [place], [place]  [address] Phone: [phone] Fax:  [phone]  Date:  02/13/2015   ID:  [name], DOB 02/03/1925, MRN [id number]  PCP:  [name], MD   History of Present Illness: [name] is a 81 y.o. male with hx of pacemaker placed in July 2010 due to heart block. Prior cardiac cath (mild LAD disease of 30-40% stenosis, 75% proximal stenosis of diagonal branch. and medically management) Echocardiogram with normal EF.   Previously he was feeling heart fluttering at approximately 2 AM. Toprol-XL 25 mg has helped out significantly. No recent arrhythmias on pacemaker interrogation. Today he states the flutterying resolved and he is sleeping better. He feels the medication helped him feel alot better. He denies any complaints..  Patient denies chest pain, dizziness, syncope, swelling, SOB, nor PND.    Wt Readings from Last 3 Encounters:  02/13/15 164 lb 12.8 oz (74.753 kg)  08/14/14 167 lb (75.751 kg)  08/11/14 166 lb 12.8 oz (75.66 kg)     Past Medical History  Diagnosis Date  . Hyperlipidemia   . History of colon cancer, stage III   . Essential hypertension, benign   . BPH (benign prostatic hyperplasia)   . Atrioventricular block, complete     a. s/p MDT ADDRL1 pacemaker implanted by Dr Verlon Setting  . Hematuria, unspecified   . Uveitis   . CAD (coronary artery disease) 05/02/2013    a. Prior cardiac cath (mild LAD disease of 30-40% stenosis, 75% proximal stenosis of diagonal branch and medically management). hISTORY OF PACEMAKER, NON-OBSTRUCTIVE CAD MEDICALLY STABLE.  . Glaucoma   . Ulcer     DU    Past Surgical History  Procedure Laterality Date  . Cardiac catheterization      Prior cardiac cath (mild LAD disease of 30-40% stenosis, 75% proximal stenosis of diagonal branch. and medically management)  . Pacemaker insertion  11/2008    MDT Adapta L implanted for Complete heart block by Dr Verlon Setting  . Partial colectomy   04/2007  . Transurethral resection of prostate  2002    Current Outpatient Prescriptions  Medication Sig Dispense Refill  . amLODipine (NORVASC) 5 MG tablet Take 5 mg by mouth daily.    Marland Kitchen aspirin 81 MG tablet Take 81 mg by mouth every other day.     . cholecalciferol (VITAMIN D) 1000 UNITS tablet Take 2,000 Units by mouth daily.    . hydrochlorothiazide (HYDRODIURIL) 12.5 MG tablet   6  . latanoprost (XALATAN) 0.005 % ophthalmic solution     . metoprolol succinate (TOPROL XL) 25 MG 24 hr tablet Take 1 tablet (25 mg total) by mouth daily. 30 tablet 5  . Multiple Vitamins-Minerals (MULTIVITAMIN PO) Take 1 capsule by mouth every morning.     Marland Kitchen omeprazole (PRILOSEC) 20 MG capsule Take 20 mg by mouth daily.    . simvastatin (ZOCOR) 20 MG tablet Take 20 mg by mouth every evening.     No current facility-administered medications for this visit.    Allergies:    Allergies  Allergen Reactions  . Levaquin [Levofloxacin In D5w] Other (See Comments)    DIZZINESS    Social History:  The patient  reports that he has never smoked. He does not have any smokeless tobacco history on file. He reports that he does not drink alcohol or use illicit drugs.   ROS:  Please see the history of present illness.   No syncope, no  CP, no SOB. Great exercise tolerance.     PHYSICAL EXAM: VS:  BP 130/72 mmHg  Pulse 74  Ht 5\' 10"  (1.778 m)  Wt 164 lb 12.8 oz (74.753 kg)  BMI 23.65 kg/m2  SpO2 95% Thin, well developed, in no acute distress HEENT: normal Neck: no JVD Cardiac:  normal S1, S2; RRR; no murmurPacemaker Lungs:  clear to auscultation bilaterally, no wheezing, rhonchi or rales Abd: soft, nontender, no hepatomegaly Ext: no edema Skin: warm and dry Neuro: no focal abnormalities noted  EKG:  Pacemaker interrogation normal  LDL 75  ASSESSMENT AND PLAN:  1. Pacemaker-functioning well. No changes. 2. Palpitations-improved on metoprolol. Continue. 3. Hyperlipidemia-simvastatin, low-dose. He is  having blood work done by Dr. Lysle Rubens. Labs ok. 4. Coronary artery disease-continue with aggressive secondary prevention. Medical management.No active angina. 5. Essential hypertension - controlled. Meds reviewed. 6. We will see him back in one year.  Signed, Candee Furbish, MD [facility]  02/13/2015 8:09 AM

## 2015-02-13 NOTE — Patient Instructions (Signed)
Medication Instructions:  The current medical regimen is effective;  continue present plan and medications.  Follow-Up: Follow up in 1 year with Dr. Skains.  You will receive a letter in the mail 2 months before you are due.  Please call us when you receive this letter to schedule your follow up appointment.  Thank you for choosing [facility]!!     

## 2015-02-19 LAB — CUP PACEART REMOTE DEVICE CHECK
Brady Statistic AP VS Percent: 0 %
Brady Statistic AS VS Percent: 0 %
Date Time Interrogation Session: 20160912122948
Lead Channel Impedance Value: 492 Ohm
Lead Channel Pacing Threshold Amplitude: 0.625 V
Lead Channel Pacing Threshold Pulse Width: 0.4 ms
Lead Channel Pacing Threshold Pulse Width: 0.4 ms
MDC IDC MSMT BATTERY IMPEDANCE: 671 Ohm
MDC IDC MSMT BATTERY REMAINING LONGEVITY: 66 mo
MDC IDC MSMT BATTERY VOLTAGE: 2.78 V
MDC IDC MSMT LEADCHNL RA IMPEDANCE VALUE: 438 Ohm
MDC IDC MSMT LEADCHNL RA PACING THRESHOLD AMPLITUDE: 1 V
MDC IDC SET LEADCHNL RA PACING AMPLITUDE: 2 V
MDC IDC SET LEADCHNL RV PACING AMPLITUDE: 2.5 V
MDC IDC SET LEADCHNL RV PACING PULSEWIDTH: 0.4 ms
MDC IDC SET LEADCHNL RV SENSING SENSITIVITY: 2 mV
MDC IDC STAT BRADY AP VP PERCENT: 93 %
MDC IDC STAT BRADY AS VP PERCENT: 7 %

## 2015-02-27 ENCOUNTER — Encounter: Payer: Self-pay | Admitting: Cardiology

## 2015-03-02 ENCOUNTER — Telehealth: Payer: Self-pay | Admitting: Internal Medicine

## 2015-03-02 NOTE — Telephone Encounter (Signed)
HCTZ ---She is going to check and see if he stopped and if the dizziness continues will call back and discuss with Dr [name].  She will call us back and let us know

## 2015-03-02 NOTE — Telephone Encounter (Signed)
New message      Pt is still dizzy.  Daughter said Dr [name] wanted pt to stop a medication during the summer.  They cannot remember which medication he is supposed to stop.  Please call.

## 2015-03-11 ENCOUNTER — Encounter: Payer: Self-pay | Admitting: Internal Medicine

## 2015-05-11 ENCOUNTER — Ambulatory Visit (INDEPENDENT_AMBULATORY_CARE_PROVIDER_SITE_OTHER): Payer: Medicare Other | Admitting: *Deleted

## 2015-05-11 DIAGNOSIS — I442 Atrioventricular block, complete: Secondary | ICD-10-CM

## 2015-05-12 ENCOUNTER — Encounter: Payer: Self-pay | Admitting: Cardiology

## 2015-05-13 NOTE — Progress Notes (Signed)
Remote pacemaker transmission.   

## 2015-05-15 LAB — CUP PACEART REMOTE DEVICE CHECK
Battery Impedance: 722 Ohm
Brady Statistic AP VP Percent: 93 %
Brady Statistic AP VS Percent: 0 %
Brady Statistic AS VP Percent: 7 %
Brady Statistic AS VS Percent: 0 %
Date Time Interrogation Session: 20161213175443
Implantable Lead Implant Date: 20100726
Implantable Lead Implant Date: 20100726
Implantable Lead Location: 753860
Implantable Lead Model: 5076
Implantable Lead Model: 5076
Lead Channel Setting Pacing Amplitude: 2.5 V
Lead Channel Setting Pacing Pulse Width: 0.4 ms
Lead Channel Setting Sensing Sensitivity: 2 mV
MDC IDC LEAD LOCATION: 753859
MDC IDC MSMT BATTERY REMAINING LONGEVITY: 64 mo
MDC IDC MSMT BATTERY VOLTAGE: 2.78 V
MDC IDC MSMT LEADCHNL RA IMPEDANCE VALUE: 438 Ohm
MDC IDC MSMT LEADCHNL RV IMPEDANCE VALUE: 502 Ohm
MDC IDC SET LEADCHNL RA PACING AMPLITUDE: 2 V

## 2015-05-20 ENCOUNTER — Encounter: Payer: Self-pay | Admitting: Cardiology

## 2015-08-12 ENCOUNTER — Encounter: Payer: Self-pay | Admitting: Hematology & Oncology

## 2015-08-12 ENCOUNTER — Ambulatory Visit (HOSPITAL_BASED_OUTPATIENT_CLINIC_OR_DEPARTMENT_OTHER): Payer: Medicare Other | Admitting: Hematology & Oncology

## 2015-08-12 ENCOUNTER — Other Ambulatory Visit (HOSPITAL_BASED_OUTPATIENT_CLINIC_OR_DEPARTMENT_OTHER): Payer: Medicare Other

## 2015-08-12 VITALS — BP 130/74 | HR 70 | Temp 97.4°F | Resp 16 | Ht 70.0 in | Wt 165.0 lb

## 2015-08-12 DIAGNOSIS — Z85038 Personal history of other malignant neoplasm of large intestine: Secondary | ICD-10-CM

## 2015-08-12 LAB — COMPREHENSIVE METABOLIC PANEL
ALBUMIN: 3.7 g/dL (ref 3.5–5.0)
ALK PHOS: 91 U/L (ref 40–150)
ALT: 19 U/L (ref 0–55)
AST: 26 U/L (ref 5–34)
Anion Gap: 8 mEq/L (ref 3–11)
BUN: 22.4 mg/dL (ref 7.0–26.0)
CHLORIDE: 107 meq/L (ref 98–109)
CO2: 30 mEq/L — ABNORMAL HIGH (ref 22–29)
CREATININE: 1.2 mg/dL (ref 0.7–1.3)
Calcium: 9.6 mg/dL (ref 8.4–10.4)
EGFR: 65 mL/min/{1.73_m2} — ABNORMAL LOW (ref >90)
Glucose: 123 mg/dl (ref 70–140)
Potassium: 3.9 mEq/L (ref 3.5–5.1)
Sodium: 145 mEq/L (ref 136–145)
Total Bilirubin: 0.73 mg/dL (ref 0.20–1.20)
Total Protein: 7.4 g/dL (ref 6.4–8.3)

## 2015-08-12 LAB — CBC WITH DIFFERENTIAL (CANCER CENTER ONLY)
BASO#: 0.1 10*3/uL (ref 0.0–0.2)
BASO%: 1.9 % (ref 0.0–2.0)
EOS%: 4.9 % (ref 0.0–7.0)
Eosinophils Absolute: 0.3 10*3/uL (ref 0.0–0.5)
HEMATOCRIT: 41.8 % (ref 38.7–49.9)
HGB: 13.3 g/dL (ref 13.0–17.1)
LYMPH#: 2.3 10*3/uL (ref 0.9–3.3)
LYMPH%: 39.3 % (ref 14.0–48.0)
MCH: 24.1 pg — ABNORMAL LOW (ref 28.0–33.4)
MCHC: 31.8 g/dL — ABNORMAL LOW (ref 32.0–35.9)
MCV: 76 fL — ABNORMAL LOW (ref 82–98)
MONO#: 0.6 10*3/uL (ref 0.1–0.9)
MONO%: 10.7 % (ref 0.0–13.0)
NEUT#: 2.6 10*3/uL (ref 1.5–6.5)
NEUT%: 43.2 % (ref 40.0–80.0)
Platelets: 196 10*3/uL (ref 145–400)
RBC: 5.51 10*6/uL (ref 4.20–5.70)
RDW: 14.6 % (ref 11.1–15.7)
WBC: 5.9 10*3/uL (ref 4.0–10.0)

## 2015-08-12 NOTE — Progress Notes (Signed)
Hematology and Oncology Follow Up Visit  SNYDER BOSSIER [id number] [birth year] 82 y.o. 08/12/2015   Principle Diagnosis:  Stage IIIa (T1, N1, M0) adenocarcinoma of the sigmoid colon Current Therapy:    Observation     Interim History:  Mr.  Firpo is back for his yearly followup. He's doing okay. I typically see him informally every couple months when he comes in with his wife.  He is doing okay. He has a pacemaker. This is being checked tomorrow.  He's had no problems with fever. He's had no abdominal pain. He's had no change in bowel or bladder habits. He's had no leg swelling. He's had no cough.  Overall, I would say that his performance status is ECOG 1.  Medications:  Current outpatient prescriptions:  .  amLODipine (NORVASC) 5 MG tablet, Take 5 mg by mouth daily., Disp: , Rfl:  .  aspirin 81 MG tablet, Take 81 mg by mouth every other day. , Disp: , Rfl:  .  cholecalciferol (VITAMIN D) 1000 UNITS tablet, Take 2,000 Units by mouth daily., Disp: , Rfl:  .  hydrochlorothiazide (HYDRODIURIL) 12.5 MG tablet, , Disp: , Rfl: 6 .  latanoprost (XALATAN) 0.005 % ophthalmic solution, , Disp: , Rfl:  .  metoprolol succinate (TOPROL XL) 25 MG 24 hr tablet, Take 1 tablet (25 mg total) by mouth daily., Disp: 30 tablet, Rfl: 5 .  Multiple Vitamins-Minerals (MULTIVITAMIN PO), Take 1 capsule by mouth every morning. , Disp: , Rfl:  .  omeprazole (PRILOSEC) 20 MG capsule, Take 20 mg by mouth daily., Disp: , Rfl:  .  simvastatin (ZOCOR) 20 MG tablet, Take 20 mg by mouth every evening., Disp: , Rfl:   Allergies:  Allergies  Allergen Reactions  . Levaquin [Levofloxacin In D5w] Other (See Comments)    DIZZINESS    Past Medical History, Surgical history, Social history, and Family History were reviewed and updated.  Review of Systems: As above  Physical Exam:  height is 5\' 10"  (1.778 m) and weight is 165 lb (74.844 kg). His oral temperature is 97.4 F (36.3 C). His blood pressure is  130/74 and his pulse is 70. His respiration is 16.   Well-developed well-nourished African-American gentleman. Head and neck exam shows no ocular or oral lesions. He has no adenopathy in his neck. Thyroid is non-palpable. Abdomen is soft. He has a well-healed laparoscopy scar. There is no fluid wave. He has no palpable liver or spleen tip.. Lungs are clear. Cardiac exam regular rate and rhythm with no murmurs, rubs or bruits.. Extremities show no clubbing, cyanosis or edema. He has age related osteoarthritic changes.. Skin exam shows no rashes ecchymosis or petechia. Neurological exam shows no focal deficits.  Lab Results  Component Value Date   WBC 5.9 08/12/2015   HGB 13.3 08/12/2015   HCT 41.8 08/12/2015   MCV 76* 08/12/2015   PLT 196 08/12/2015     Chemistry      Component Value Date/Time   NA 141 08/14/2014 0821   NA 143 09/22/2009 0918   K 3.5 08/14/2014 0821   K 4.0 09/22/2009 0918   CL 104 08/14/2014 0821   CL 104 09/22/2009 0918   CO2 24 08/14/2014 0821   CO2 30 09/22/2009 0918   BUN 26* 08/14/2014 0821   BUN 22 09/22/2009 0918   CREATININE 1.08 08/14/2014 0821   CREATININE 1.1 09/22/2009 0918      Component Value Date/Time   CALCIUM 9.5 08/14/2014 0821   CALCIUM 9.6 09/22/2009 [id number]  ALKPHOS 78 08/14/2014 0821   ALKPHOS 128* 09/22/2009 0918   AST 24 08/14/2014 0821   AST 26 09/22/2009 0918   ALT 16 08/14/2014 0821   ALT 19 09/22/2009 0918   BILITOT 0.6 08/14/2014 0821   BILITOT 0.90 09/22/2009 0918         Impression and Plan: Mr. Ishii is an 82 year old African-American male.Marland Kitchen He had stage IIIa colon cancer. He's doing well. He is now over 8 years out.  I see no evidence of recurrent disease. He is doing remarkably well.  I will plan to see him back in one year. I will see him back when he brings his wife for her appointments. Volanda Napoleon, MD 3/15/201710:01 AM

## 2015-08-13 ENCOUNTER — Encounter: Payer: Self-pay | Admitting: Internal Medicine

## 2015-08-13 ENCOUNTER — Ambulatory Visit (INDEPENDENT_AMBULATORY_CARE_PROVIDER_SITE_OTHER): Payer: Medicare Other | Admitting: Internal Medicine

## 2015-08-13 VITALS — BP 128/76 | HR 82 | Ht 70.0 in | Wt 167.6 lb

## 2015-08-13 DIAGNOSIS — I2583 Coronary atherosclerosis due to lipid rich plaque: Secondary | ICD-10-CM

## 2015-08-13 DIAGNOSIS — I10 Essential (primary) hypertension: Secondary | ICD-10-CM

## 2015-08-13 DIAGNOSIS — I442 Atrioventricular block, complete: Secondary | ICD-10-CM

## 2015-08-13 DIAGNOSIS — I2510 Atherosclerotic heart disease of native coronary artery without angina pectoris: Secondary | ICD-10-CM

## 2015-08-13 LAB — CUP PACEART INCLINIC DEVICE CHECK
Battery Remaining Longevity: 60 mo
Brady Statistic AP VP Percent: 93 %
Brady Statistic AP VS Percent: 0 %
Brady Statistic AS VS Percent: 0 %
Implantable Lead Implant Date: 20100726
Implantable Lead Location: 753859
Implantable Lead Location: 753860
Lead Channel Impedance Value: 490 Ohm
Lead Channel Pacing Threshold Amplitude: 0.5 V
Lead Channel Pacing Threshold Pulse Width: 0.4 ms
Lead Channel Setting Pacing Amplitude: 2 V
Lead Channel Setting Pacing Pulse Width: 0.4 ms
Lead Channel Setting Sensing Sensitivity: 2 mV
MDC IDC LEAD IMPLANT DT: 20100726
MDC IDC MSMT BATTERY IMPEDANCE: 798 Ohm
MDC IDC MSMT BATTERY VOLTAGE: 2.78 V
MDC IDC MSMT LEADCHNL RA IMPEDANCE VALUE: 438 Ohm
MDC IDC MSMT LEADCHNL RA PACING THRESHOLD AMPLITUDE: 1 V
MDC IDC MSMT LEADCHNL RA SENSING INTR AMPL: 2.8 mV
MDC IDC MSMT LEADCHNL RV PACING THRESHOLD PULSEWIDTH: 0.4 ms
MDC IDC SESS DTM: 20170316201925
MDC IDC SET LEADCHNL RV PACING AMPLITUDE: 2.5 V
MDC IDC STAT BRADY AS VP PERCENT: 7 %

## 2015-08-13 LAB — CEA: CEA1: 4.5 ng/mL (ref 0.0–4.7)

## 2015-08-13 LAB — CEA (PARALLEL TESTING): CEA: 2.1 ng/mL

## 2015-08-13 NOTE — Progress Notes (Signed)
Eddie Low, MD: Primary Cardiologist: [name] is a 82 y.o. male with a h/o complete heart block sp PPM (MDT) by Dr Verlon Setting who presents today to follow-up in the Electrophysiology device clinic.  He remains active for his age.  He continues to cut his own firewood.  He has occasional postural dizziness but no other complaints. Today, he  denies symptoms of palpitations, chest pain, shortness of breath, orthopnea, PND, lower extremity edema, presyncope, syncope, or neurologic sequela.  The patientis tolerating medications without difficulties and is otherwise without complaint today.   Past Medical History  Diagnosis Date  . Hyperlipidemia   . History of colon cancer, stage III   . Essential hypertension, benign   . BPH (benign prostatic hyperplasia)   . Atrioventricular block, complete (HCC)     a. s/p MDT ADDRL1 pacemaker implanted by Dr Verlon Setting  . Hematuria, unspecified   . Uveitis   . CAD (coronary artery disease) 05/02/2013    a. Prior cardiac cath (mild LAD disease of 30-40% stenosis, 75% proximal stenosis of diagonal branch and medically management). hISTORY OF PACEMAKER, NON-OBSTRUCTIVE CAD MEDICALLY STABLE.  . Glaucoma   . Ulcer     DU   Past Surgical History  Procedure Laterality Date  . Cardiac catheterization      Prior cardiac cath (mild LAD disease of 30-40% stenosis, 75% proximal stenosis of diagonal branch. and medically management)  . Pacemaker insertion  11/2008    MDT Adapta L implanted for Complete heart block by Dr Verlon Setting  . Partial colectomy  04/2007  . Transurethral resection of prostate  2002    Social History   Social History  . Marital Status: Married    Spouse Name: N/A  . Number of Children: N/A  . Years of Education: N/A   Occupational History  . Not on file.   Social History Main Topics  . Smoking status: Never Smoker   . Smokeless tobacco: Not on file  . Alcohol Use: No  . Drug Use: No  . Sexual Activity: Not on file     Other Topics Concern  . Not on file   Social History Narrative    Family History  Problem Relation Age of Onset  . Heart disease Mother   . CAD Mother   . Heart disease Father   . CAD Father   . Heart disease Brother   . CAD Brother   . Diabetes Sister   . Gout Brother   . Alcohol abuse Brother   . Liver disease Brother     Allergies  Allergen Reactions  . Levaquin [Levofloxacin In D5w] Other (See Comments)    DIZZINESS    Current Outpatient Prescriptions  Medication Sig Dispense Refill  . amLODipine (NORVASC) 5 MG tablet Take 5 mg by mouth daily.    Marland Kitchen aspirin 81 MG tablet Take 81 mg by mouth every other day.     . cholecalciferol (VITAMIN D) 1000 UNITS tablet Take 2,000 Units by mouth daily.    . hydrochlorothiazide (HYDRODIURIL) 12.5 MG tablet   6  . latanoprost (XALATAN) 0.005 % ophthalmic solution     . metoprolol succinate (TOPROL XL) 25 MG 24 hr tablet Take 1 tablet (25 mg total) by mouth daily. 30 tablet 5  . Multiple Vitamins-Minerals (MULTIVITAMIN PO) Take 1 capsule by mouth every morning.     Marland Kitchen omeprazole (PRILOSEC) 20 MG capsule Take 20 mg by mouth daily.    . simvastatin (ZOCOR) 20 MG tablet  Take 20 mg by mouth every evening.     No current facility-administered medications for this visit.    ROS- all systems are reviewed and negative except as per HPI  Physical Exam: Filed Vitals:   08/13/15 1601  BP: 128/76  Pulse: 82  Height: 5\' 10"  (1.778 m)  Weight: 167 lb 9.6 oz (76.023 kg)    GEN- The patient is well appearing, alert and oriented x 3 today.   Head- normocephalic, atraumatic Eyes-  Sclera clear, conjunctiva pink Ears- hearing intact Oropharynx- clear Neck- supple, no JVP Lymph- no cervical lymphadenopathy Lungs- Clear to ausculation bilaterally, normal work of breathing Chest- pacemaker pocket is well healed Heart- Regular rate and rhythm, no murmurs, rubs or gallops, PMI not laterally displaced GI- soft, NT, ND, + BS Extremities-  no clubbing, cyanosis, or edema MS- no significant deformity or atrophy Skin- no rash or lesion Psych- euthymic mood, full affect Neuro- strength and sensation are intact  Pacemaker interrogation- reviewed in detail today,  See PACEART report ekg today reveals AV sequential pacing  Assessment and Plan:  1. Complete heart block Normal pacemaker function See Pace Art report No changes today  2. htn Stable Given dizziness and advanced age, will stop hctz at this time Goal BP <150/90  3. Cad No ischemic symptoms No changes  carelink Return to see EP NP in the device clinic in 1 year Follow-up with Dr [name] as scheduled  Thompson Grayer MD, [facility] 08/13/2015 4:17 PM   Follow-up with Dr [name] as scheduled

## 2015-08-13 NOTE — Patient Instructions (Signed)
Medication Instructions:  Your physician has recommended you make the following change in your medication:  1) stop HCTZ   Labwork: None ordered   Testing/Procedures: None ordered   Follow-Up: Your physician wants you to follow-up in: 12 months with Chanetta Marshall, NP You will receive a reminder letter in the mail two months in advance. If you don't receive a letter, please call our office to schedule the follow-up appointment.   Remote monitoring is used to monitor your Pacemaker  from home. This monitoring reduces the number of office visits required to check your device to one time per year. It allows [place] to keep an eye on the functioning of your device to ensure it is working properly. You are scheduled for a device check from home on 11/12/15. You may send your transmission at any time that day. If you have a wireless device, the transmission will be sent automatically. After your physician reviews your transmission, you will receive a postcard with your next transmission date.    Any Other Special Instructions Will Be Listed Below (If Applicable).     If you need a refill on your cardiac medications before your next appointment, please call your pharmacy.

## 2015-11-12 ENCOUNTER — Encounter: Payer: Medicare Other | Admitting: *Deleted

## 2015-11-12 ENCOUNTER — Telehealth: Payer: Self-pay | Admitting: Cardiology

## 2015-11-12 NOTE — Telephone Encounter (Signed)
Spoke with pt and reminded pt of remote transmission that is due today. Pt verbalized understanding.   

## 2015-11-13 ENCOUNTER — Encounter: Payer: Self-pay | Admitting: Cardiology

## 2015-11-13 ENCOUNTER — Telehealth: Payer: Self-pay | Admitting: Internal Medicine

## 2015-11-13 NOTE — Telephone Encounter (Signed)
ERROR

## 2015-11-26 ENCOUNTER — Ambulatory Visit (INDEPENDENT_AMBULATORY_CARE_PROVIDER_SITE_OTHER): Payer: Medicare Other | Admitting: *Deleted

## 2015-11-26 DIAGNOSIS — I442 Atrioventricular block, complete: Secondary | ICD-10-CM

## 2015-11-27 ENCOUNTER — Encounter: Payer: Self-pay | Admitting: Cardiology

## 2015-11-27 LAB — CUP PACEART REMOTE DEVICE CHECK
Battery Impedance: 927 Ohm
Battery Remaining Longevity: 56 mo
Brady Statistic AP VP Percent: 94 %
Brady Statistic AS VS Percent: 0 %
Implantable Lead Implant Date: 20100726
Implantable Lead Location: 753859
Implantable Lead Model: 5076
Lead Channel Impedance Value: 490 Ohm
Lead Channel Impedance Value: 495 Ohm
Lead Channel Pacing Threshold Amplitude: 0.875 V
Lead Channel Setting Pacing Amplitude: 2 V
Lead Channel Setting Pacing Amplitude: 2.5 V
MDC IDC LEAD IMPLANT DT: 20100726
MDC IDC LEAD LOCATION: 753860
MDC IDC MSMT BATTERY VOLTAGE: 2.77 V
MDC IDC MSMT LEADCHNL RA PACING THRESHOLD PULSEWIDTH: 0.4 ms
MDC IDC MSMT LEADCHNL RV PACING THRESHOLD AMPLITUDE: 0.625 V
MDC IDC MSMT LEADCHNL RV PACING THRESHOLD PULSEWIDTH: 0.4 ms
MDC IDC SESS DTM: 20170629164540
MDC IDC SET LEADCHNL RV PACING PULSEWIDTH: 0.4 ms
MDC IDC SET LEADCHNL RV SENSING SENSITIVITY: 2 mV
MDC IDC STAT BRADY AP VS PERCENT: 0 %
MDC IDC STAT BRADY AS VP PERCENT: 6 %

## 2015-11-27 NOTE — Progress Notes (Signed)
Remote pacemaker transmission.   

## 2016-02-22 ENCOUNTER — Ambulatory Visit (INDEPENDENT_AMBULATORY_CARE_PROVIDER_SITE_OTHER): Payer: Medicare Other | Admitting: Cardiology

## 2016-02-22 ENCOUNTER — Encounter: Payer: Self-pay | Admitting: Cardiology

## 2016-02-22 VITALS — BP 132/72 | HR 78 | Ht 70.0 in | Wt 176.4 lb

## 2016-02-22 DIAGNOSIS — I2583 Coronary atherosclerosis due to lipid rich plaque: Secondary | ICD-10-CM

## 2016-02-22 DIAGNOSIS — I442 Atrioventricular block, complete: Secondary | ICD-10-CM | POA: Diagnosis not present

## 2016-02-22 DIAGNOSIS — I2510 Atherosclerotic heart disease of native coronary artery without angina pectoris: Secondary | ICD-10-CM | POA: Diagnosis not present

## 2016-02-22 DIAGNOSIS — R002 Palpitations: Secondary | ICD-10-CM

## 2016-02-22 DIAGNOSIS — Z950 Presence of cardiac pacemaker: Secondary | ICD-10-CM | POA: Diagnosis not present

## 2016-02-22 MED ORDER — METOPROLOL SUCCINATE ER 25 MG PO TB24: 25.0000 mg | ORAL_TABLET | Freq: Every day | ORAL | 3 refills | Status: DC

## 2016-02-22 MED ORDER — SIMVASTATIN 20 MG PO TABS
20.0000 mg | ORAL_TABLET | Freq: Every evening | ORAL | 3 refills | Status: DC
Start: 2016-02-22 — End: 2016-11-15

## 2016-02-22 MED ORDER — AMLODIPINE BESYLATE 5 MG PO TABS: 5.0000 mg | ORAL_TABLET | Freq: Every day | ORAL | 3 refills | Status: DC

## 2016-02-22 NOTE — Progress Notes (Signed)
[facility] [address], [address], [place]  [address] Phone: [phone] Fax:  [phone]  Date:  02/22/2016   ID:  [name], DOB 05/26/1925, MRN [id number]  PCP:  [name], MD   History of Present Illness: [name] is a 82 y.o. male with hx of pacemaker placed in July 2010 due to heart block. Prior cardiac cath (mild LAD disease of 30-40% stenosis, 75% proximal stenosis of diagonal branch. and medically management) Echocardiogram with normal EF.   Previously he was feeling heart fluttering at approximately 2 AM. Toprol-XL 25 mg has helped out significantly. No recent arrhythmias on pacemaker interrogation. Today he states the flutterying resolved and he is sleeping better. He feels the medication helped him feel alot better. He denies any complaints..   Wife died 2016-01-08 after 68 years of marriage. They have 5 children. All of them are in the ministry.   Recently got hearing aids.  Occasionally when he is getting up in the morning and using his left arm to help get out of bed chest discomfort in his axillary region. This goes away throughout the day. Seems to be associated with musculoskeletal strain.  Patient denies chest pain, dizziness, syncope, swelling, SOB, nor PND.    Wt Readings from Last 3 Encounters:  02/22/16 176 lb 6.4 oz (80 kg)  08/13/15 167 lb 9.6 oz (76 kg)  08/12/15 165 lb (74.8 kg)     Past Medical History:  Diagnosis Date  . Atrioventricular block, complete (HCC)    a. s/p MDT ADDRL1 pacemaker implanted by Dr Verlon Setting  . BPH (benign prostatic hyperplasia)   . CAD (coronary artery disease) 05/02/2013   a. Prior cardiac cath (mild LAD disease of 30-40% stenosis, 75% proximal stenosis of diagonal branch and medically management). hISTORY OF PACEMAKER, NON-OBSTRUCTIVE CAD MEDICALLY STABLE.  . Essential hypertension, benign   . Glaucoma   . Hematuria, unspecified   . History of colon cancer, stage III   . Hyperlipidemia   .  Ulcer    DU  . Uveitis     Past Surgical History:  Procedure Laterality Date  . CARDIAC CATHETERIZATION     Prior cardiac cath (mild LAD disease of 30-40% stenosis, 75% proximal stenosis of diagonal branch. and medically management)  . PACEMAKER INSERTION  11/2008   MDT Adapta L implanted for Complete heart block by Dr Verlon Setting  . PARTIAL COLECTOMY  04/2007  . TRANSURETHRAL RESECTION OF PROSTATE  2002    Current Outpatient Prescriptions  Medication Sig Dispense Refill  . amLODipine (NORVASC) 5 MG tablet Take 1 tablet (5 mg total) by mouth daily. 90 tablet 3  . aspirin 81 MG tablet Take 81 mg by mouth every other day.     . cholecalciferol (VITAMIN D) 1000 UNITS tablet Take 2,000 Units by mouth daily.    Marland Kitchen latanoprost (XALATAN) 0.005 % ophthalmic solution Place 1 drop into both eyes at bedtime.     . metoprolol succinate (TOPROL XL) 25 MG 24 hr tablet Take 1 tablet (25 mg total) by mouth daily. 90 tablet 3  . Multiple Vitamins-Minerals (MULTIVITAMIN PO) Take 1 capsule by mouth every morning.     Marland Kitchen omeprazole (PRILOSEC) 20 MG capsule Take 20 mg by mouth daily.    . simvastatin (ZOCOR) 20 MG tablet Take 1 tablet (20 mg total) by mouth every evening. 90 tablet 3   No current facility-administered medications for this visit.     Allergies:  Allergies  Allergen Reactions  . Levaquin [Levofloxacin In D5w] Other (See Comments)    DIZZINESS    Social History:  The patient  reports that he has never smoked. He does not have any smokeless tobacco history on file. He reports that he does not drink alcohol or use drugs.   ROS:  Please see the history of present illness.   No syncope, no CP, no SOB. Great exercise tolerance.     PHYSICAL EXAM: VS:  BP 132/72   Pulse 78   Ht 5\' 10"  (1.778 m)   Wt 176 lb 6.4 oz (80 kg)   BMI 25.31 kg/m  Thin, well developed, in no acute distress  HEENT: normal  Neck: no JVD  Cardiac:  normal S1, S2; RRR; no murmur Pacemaker Lungs:  clear to  auscultation bilaterally, no wheezing, rhonchi or rales  Abd: soft, nontender, no hepatomegaly  Ext: no edema  Skin: warm and dry  Neuro: no focal abnormalities noted  EKG:  Pacemaker interrogation normal  LDL 75  ASSESSMENT AND PLAN:  1. Pacemaker/complete heart block-functioning well. MDT, No changes. 2. Palpitations-improved on metoprolol. Continue. 3. Hyperlipidemia-simvastatin, low-dose. He is having blood work done by Dr. Lysle Rubens. Labs ok. 4. Coronary artery disease-continue with aggressive secondary prevention. Medical management.No active angina.His left axillary chest discomfort is likely musculoskeletal when he is trying to get up out of bed in the morning. 5. Essential hypertension - controlled. Meds reviewed. HCTZ stopped by Dr. [name] because of dizziness and advanced age on 07/2015. He is doing well. 6. We will see him back in one year.  Signed, Candee Furbish, MD [facility]  02/22/2016 10:51 AM

## 2016-02-22 NOTE — Patient Instructions (Signed)

## 2016-02-29 ENCOUNTER — Ambulatory Visit (INDEPENDENT_AMBULATORY_CARE_PROVIDER_SITE_OTHER): Payer: Medicare Other | Admitting: *Deleted

## 2016-02-29 DIAGNOSIS — I442 Atrioventricular block, complete: Secondary | ICD-10-CM

## 2016-02-29 NOTE — Progress Notes (Signed)
Remote pacemaker transmission.   

## 2016-03-01 LAB — CUP PACEART REMOTE DEVICE CHECK
Battery Remaining Longevity: 52 mo
Battery Voltage: 2.77 V
Brady Statistic AS VS Percent: 0 %
Implantable Lead Implant Date: 20100726
Implantable Lead Location: 753859
Lead Channel Impedance Value: 460 Ohm
Lead Channel Impedance Value: 553 Ohm
Lead Channel Pacing Threshold Amplitude: 0.625 V
Lead Channel Setting Pacing Amplitude: 2 V
Lead Channel Setting Pacing Amplitude: 2.5 V
Lead Channel Setting Pacing Pulse Width: 0.4 ms
Lead Channel Setting Sensing Sensitivity: 2 mV
MDC IDC LEAD IMPLANT DT: 20100726
MDC IDC LEAD LOCATION: 753860
MDC IDC MSMT BATTERY IMPEDANCE: 1056 Ohm
MDC IDC MSMT LEADCHNL RA PACING THRESHOLD AMPLITUDE: 1 V
MDC IDC MSMT LEADCHNL RA PACING THRESHOLD PULSEWIDTH: 0.4 ms
MDC IDC MSMT LEADCHNL RV PACING THRESHOLD PULSEWIDTH: 0.4 ms
MDC IDC SESS DTM: 20171002125035
MDC IDC STAT BRADY AP VP PERCENT: 93 %
MDC IDC STAT BRADY AP VS PERCENT: 0 %
MDC IDC STAT BRADY AS VP PERCENT: 7 %

## 2016-03-02 ENCOUNTER — Encounter: Payer: Self-pay | Admitting: Cardiology

## 2016-05-31 ENCOUNTER — Ambulatory Visit (INDEPENDENT_AMBULATORY_CARE_PROVIDER_SITE_OTHER): Payer: Medicare Other | Admitting: *Deleted

## 2016-05-31 ENCOUNTER — Telehealth: Payer: Self-pay | Admitting: Cardiology

## 2016-05-31 DIAGNOSIS — I442 Atrioventricular block, complete: Secondary | ICD-10-CM | POA: Diagnosis not present

## 2016-05-31 NOTE — Telephone Encounter (Signed)
LMOVM reminding pt to send remote transmission.   

## 2016-06-03 NOTE — Progress Notes (Signed)
Remote pacemaker transmission.   

## 2016-06-08 ENCOUNTER — Encounter: Payer: Self-pay | Admitting: Cardiology

## 2016-06-08 LAB — CUP PACEART REMOTE DEVICE CHECK
Brady Statistic AP VP Percent: 92 %
Brady Statistic AP VS Percent: 0 %
Brady Statistic AS VS Percent: 0 %
Date Time Interrogation Session: 20180105120153
Implantable Lead Implant Date: 20100726
Implantable Lead Model: 5076
Lead Channel Impedance Value: 513 Ohm
Lead Channel Impedance Value: 608 Ohm
Lead Channel Pacing Threshold Amplitude: 0.625 V
Lead Channel Pacing Threshold Pulse Width: 0.4 ms
Lead Channel Setting Sensing Sensitivity: 2 mV
MDC IDC LEAD IMPLANT DT: 20100726
MDC IDC LEAD LOCATION: 753859
MDC IDC LEAD LOCATION: 753860
MDC IDC MSMT BATTERY IMPEDANCE: 1109 Ohm
MDC IDC MSMT BATTERY REMAINING LONGEVITY: 51 mo
MDC IDC MSMT BATTERY VOLTAGE: 2.77 V
MDC IDC MSMT LEADCHNL RA PACING THRESHOLD AMPLITUDE: 1.125 V
MDC IDC MSMT LEADCHNL RV PACING THRESHOLD PULSEWIDTH: 0.4 ms
MDC IDC PG IMPLANT DT: 20100726
MDC IDC SET LEADCHNL RA PACING AMPLITUDE: 2.25 V
MDC IDC SET LEADCHNL RV PACING AMPLITUDE: 2.5 V
MDC IDC SET LEADCHNL RV PACING PULSEWIDTH: 0.4 ms
MDC IDC STAT BRADY AS VP PERCENT: 8 %

## 2016-08-10 ENCOUNTER — Encounter: Payer: Self-pay | Admitting: Nurse Practitioner

## 2016-08-11 ENCOUNTER — Ambulatory Visit: Payer: Medicare Other | Admitting: Family

## 2016-08-11 ENCOUNTER — Other Ambulatory Visit: Payer: Medicare Other

## 2016-08-17 NOTE — Progress Notes (Signed)
Electrophysiology Office Note Date: 08/18/2016  ID:  [name], DOB Mar 18, 1925, MRN [id number]  PCP: [name], MD Primary Cardiologist: [name] Electrophysiologist: [name]  CC: Pacemaker follow-up  [name] is a 83 y.o. male seen today for Dr [name].  He presents today for routine electrophysiology followup.  Since last being seen in our clinic, the patient reports doing very well.  He denies chest pain, palpitations, dyspnea, nausea, vomiting, or syncope. He continues to have stable orthostatic intolerance without frank syncope.   He has previously had symptomatic palpitations but these have improved with Toprol.  He remains very active at home and has no functional limitations with activity.   Device History: MDT dual chamber PPM implanted 2010 for complete heart block by Dr Verlon Setting, now followed by Dr [name].    Past Medical History:  Diagnosis Date  . Atrioventricular block, complete (HCC)    a. s/p MDT ADDRL1 pacemaker implanted by Dr Verlon Setting  . BPH (benign prostatic hyperplasia)   . CAD (coronary artery disease) 05/02/2013   a. Prior cardiac cath (mild LAD disease of 30-40% stenosis, 75% proximal stenosis of diagonal branch and medically management). hISTORY OF PACEMAKER, NON-OBSTRUCTIVE CAD MEDICALLY STABLE.  . Essential hypertension, benign   . Glaucoma   . Hematuria, unspecified   . History of colon cancer, stage III   . Hyperlipidemia   . Ulcer ([place])    DU  . Uveitis    Past Surgical History:  Procedure Laterality Date  . CARDIAC CATHETERIZATION     Prior cardiac cath (mild LAD disease of 30-40% stenosis, 75% proximal stenosis of diagonal branch. and medically management)  . PACEMAKER INSERTION  11/2008   MDT Adapta L implanted for Complete heart block by Dr Verlon Setting  . PARTIAL COLECTOMY  04/2007  . TRANSURETHRAL RESECTION OF PROSTATE  2002    Current Outpatient Prescriptions  Medication Sig Dispense Refill  . amLODipine (NORVASC) 5 MG tablet  Take 1 tablet (5 mg total) by mouth daily. 90 tablet 3  . aspirin 81 MG tablet Take 81 mg by mouth every other day.     . cholecalciferol (VITAMIN D) 1000 UNITS tablet Take 2,000 Units by mouth daily.    Marland Kitchen latanoprost (XALATAN) 0.005 % ophthalmic solution Place 1 drop into both eyes at bedtime.     . metoprolol succinate (TOPROL XL) 25 MG 24 hr tablet Take 1 tablet (25 mg total) by mouth daily. 90 tablet 3  . Multiple Vitamins-Minerals (MULTIVITAMIN PO) Take 1 capsule by mouth every morning.     Marland Kitchen omeprazole (PRILOSEC) 20 MG capsule Take 20 mg by mouth daily.    . simvastatin (ZOCOR) 20 MG tablet Take 1 tablet (20 mg total) by mouth every evening. 90 tablet 3   No current facility-administered medications for this visit.     Allergies:   Levaquin [levofloxacin in d5w]   Social History: Social History   Social History  . Marital status: Married    Spouse name: N/A  . Number of children: N/A  . Years of education: N/A   Occupational History  . Not on file.   Social History Main Topics  . Smoking status: Never Smoker  . Smokeless tobacco: Never Used  . Alcohol use No  . Drug use: No  . Sexual activity: Not on file   Other Topics Concern  . Not on file   Social History Narrative  . No narrative on file    Family History: Family History  Problem Relation Age  of Onset  . Heart disease Mother   . CAD Mother   . Heart disease Father   . CAD Father   . Heart disease Brother   . CAD Brother   . Diabetes Sister   . Gout Brother   . Alcohol abuse Brother   . Liver disease Brother      Review of Systems: All other systems reviewed and are otherwise negative except as noted above.   Physical Exam: VS:  BP (!) 144/82   Pulse 69   Ht 5\' 10"  (1.778 m)   Wt 184 lb (83.5 kg)   BMI 26.40 kg/m  , BMI Body mass index is 26.4 kg/m.  GEN- The patient is elderly appearing, alert and oriented x 3 today.   HEENT: normocephalic, atraumatic; sclera clear, conjunctiva pink;  hearing intact; oropharynx clear; neck supple Lungs- Clear to ausculation bilaterally, normal work of breathing.  No wheezes, rales, rhonchi Heart- Regular rate and rhythm (paced) GI- soft, non-tender, non-distended, bowel sounds present Extremities- no clubbing, cyanosis, or edema  MS- no significant deformity or atrophy Skin- warm and dry, no rash or lesion; PPM pocket well healed Psych- euthymic mood, full affect Neuro- strength and sensation are intact  PPM Interrogation- reviewed in detail today,  See PACEART report  EKG:  EKG is ordered today. The ekg ordered today shows AV pacing  Recent Labs: No results found for requested labs within last 8760 hours.   Wt Readings from Last 3 Encounters:  08/18/16 184 lb (83.5 kg)  02/22/16 176 lb 6.4 oz (80 kg)  08/13/15 167 lb 9.6 oz (76 kg)     Other studies Reviewed: Additional studies/ records that were reviewed today include: Dr [name] and Dr [name]' prior office notes   Assessment and Plan:  1.  Complete heart block Normal PPM function - patient is device dependent today  See Pace Art report No changes today  2.  Hypertension Stable No change required today Still with some orthostatic intolerance.  Precautions reviewed today With relative HTN, I am reluctant to decrease meds.   3.  CAD No recent ischemic symptoms   Current medicines are reviewed at length with the patient today.   The patient does not have concerns regarding his medicines.  The following changes were made today:  none  Labs/ tests ordered today include: none No orders of the defined types were placed in this encounter.    Disposition:   Follow up with Dr [name] as scheduled.  Carelink every 3 months, follow up with me in 1 year    Signed, Chanetta Marshall, NP 08/18/2016 8:56 AM  [place] [address] [phone] (office) [phone] (fax)

## 2016-08-18 ENCOUNTER — Ambulatory Visit (INDEPENDENT_AMBULATORY_CARE_PROVIDER_SITE_OTHER): Payer: Medicare Other | Admitting: Nurse Practitioner

## 2016-08-18 ENCOUNTER — Encounter (INDEPENDENT_AMBULATORY_CARE_PROVIDER_SITE_OTHER): Payer: Self-pay

## 2016-08-18 VITALS — BP 144/82 | HR 69 | Ht 70.0 in | Wt 184.0 lb

## 2016-08-18 DIAGNOSIS — I2510 Atherosclerotic heart disease of native coronary artery without angina pectoris: Secondary | ICD-10-CM

## 2016-08-18 DIAGNOSIS — I10 Essential (primary) hypertension: Secondary | ICD-10-CM

## 2016-08-18 DIAGNOSIS — I442 Atrioventricular block, complete: Secondary | ICD-10-CM

## 2016-08-18 LAB — CUP PACEART INCLINIC DEVICE CHECK
Date Time Interrogation Session: 20180322124723
Implantable Lead Implant Date: 20100726
Implantable Lead Location: 753860
Implantable Lead Model: 5076
Implantable Lead Model: 5076
MDC IDC LEAD IMPLANT DT: 20100726
MDC IDC LEAD LOCATION: 753859
MDC IDC PG IMPLANT DT: 20100726

## 2016-08-18 NOTE — Patient Instructions (Addendum)
Medication Instructions:   Your physician recommends that you continue on your current medications as directed. Please refer to the Current Medication list given to you today.   If you need a refill on your cardiac medications before your next appointment, please call your pharmacy.  Labwork: NONE ORDERED  TODAY    Testing/Procedures: NONE ORDERED  TODAY    Follow-Up: Your physician wants you to follow-up in: [place] will receive a reminder letter in the mail two months in advance. If you don't receive a letter, please call our office to schedule the follow-up appointment.   Remote monitoring is used to monitor your Pacemaker of ICD from home. This monitoring reduces the number of office visits required to check your device to one time per year. It allows [place] to keep an eye on the functioning of your device to ensure it is working properly. You are scheduled for a device check from home on . 11-21-16..You may send your transmission at any time that day. If you have a wireless device, the transmission will be sent automatically. After your physician reviews your transmission, you will receive a postcard with your next transmission date.    Any Other Special Instructions Will Be Listed Below (If Applicable).

## 2016-08-22 NOTE — Addendum Note (Signed)
Addended by: Claude Manges on: 08/22/2016 10:20 AM   Modules accepted: Orders

## 2016-11-10 ENCOUNTER — Telehealth: Payer: Self-pay | Admitting: Cardiology

## 2016-11-10 NOTE — Telephone Encounter (Signed)
Returned call to pt's daughter and advised that we are in [employer]'s network.  She thanked me for returning her call.

## 2016-11-10 NOTE — Telephone Encounter (Signed)
New message    Pt daughter is calling. She states that pt feels that the area around his pacemaker is swollen. She says that pt states he is in no pain.

## 2016-11-10 NOTE — Telephone Encounter (Signed)
The patient woke up this morning and had a small pain near his PPM, felt the area and noticed some swelling. He now reports that the swelling has gone down. No redness, tenderness at the site, no fever or chills, no recurrent pains. I gave the daughter and patient the choice to be evaluated today, but since the symptoms have resolved they could monitor the area and call back if there is a recurrence. They have decided to see how the day goes and call back if they need to.  Ms. [name] mentioned that the patient is changing medical insurance plans since he no longer lives in [facility] and he wants to continue to see Dr. [name]. She is looking into [facility] and wants to know if Dr. [name] would be covered. I advised her to call [employer] to see if he is "in network" but I would route the message and see if our office had that information. She verbalizes understanding and is appreciative.

## 2016-11-15 ENCOUNTER — Other Ambulatory Visit: Payer: Self-pay | Admitting: Cardiology

## 2016-11-21 ENCOUNTER — Telehealth: Payer: Self-pay | Admitting: Cardiology

## 2016-11-21 ENCOUNTER — Encounter: Payer: Medicare Other | Admitting: *Deleted

## 2016-11-21 NOTE — Telephone Encounter (Signed)
LMOVM reminding pt to send remote transmission.   

## 2016-11-25 ENCOUNTER — Encounter: Payer: Self-pay | Admitting: Cardiology

## 2016-12-19 ENCOUNTER — Encounter: Payer: Medicare Other | Admitting: Internal Medicine

## 2016-12-20 ENCOUNTER — Encounter: Payer: Self-pay | Admitting: Internal Medicine

## 2016-12-21 ENCOUNTER — Ambulatory Visit (INDEPENDENT_AMBULATORY_CARE_PROVIDER_SITE_OTHER): Payer: Self-pay | Admitting: *Deleted

## 2016-12-21 DIAGNOSIS — I442 Atrioventricular block, complete: Secondary | ICD-10-CM

## 2016-12-22 ENCOUNTER — Encounter: Payer: Self-pay | Admitting: Cardiology

## 2016-12-22 NOTE — Progress Notes (Signed)
Remote pacemaker transmission.   

## 2017-01-20 LAB — CUP PACEART REMOTE DEVICE CHECK
Battery Remaining Longevity: 42 mo
Brady Statistic AS VP Percent: 13 %
Brady Statistic AS VS Percent: 0 %
Implantable Lead Implant Date: 20100726
Implantable Lead Implant Date: 20100726
Implantable Lead Location: 753860
Implantable Pulse Generator Implant Date: 20100726
Lead Channel Impedance Value: 510 Ohm
Lead Channel Pacing Threshold Amplitude: 0.5 V
Lead Channel Pacing Threshold Amplitude: 1.125 V
Lead Channel Pacing Threshold Pulse Width: 0.4 ms
Lead Channel Setting Pacing Amplitude: 2.5 V
Lead Channel Setting Sensing Sensitivity: 2 mV
MDC IDC LEAD LOCATION: 753859
MDC IDC MSMT BATTERY IMPEDANCE: 1407 Ohm
MDC IDC MSMT BATTERY VOLTAGE: 2.76 V
MDC IDC MSMT LEADCHNL RA IMPEDANCE VALUE: 590 Ohm
MDC IDC MSMT LEADCHNL RA PACING THRESHOLD PULSEWIDTH: 0.4 ms
MDC IDC SESS DTM: 20180725142703
MDC IDC SET LEADCHNL RV PACING AMPLITUDE: 2.5 V
MDC IDC SET LEADCHNL RV PACING PULSEWIDTH: 0.46 ms
MDC IDC STAT BRADY AP VP PERCENT: 87 %
MDC IDC STAT BRADY AP VS PERCENT: 0 %

## 2017-02-28 ENCOUNTER — Encounter (INDEPENDENT_AMBULATORY_CARE_PROVIDER_SITE_OTHER): Payer: Self-pay

## 2017-02-28 ENCOUNTER — Encounter: Payer: Self-pay | Admitting: Cardiology

## 2017-02-28 ENCOUNTER — Ambulatory Visit (INDEPENDENT_AMBULATORY_CARE_PROVIDER_SITE_OTHER): Payer: Medicare PPO | Admitting: Cardiology

## 2017-02-28 VITALS — BP 130/82 | HR 80 | Ht 71.0 in | Wt 187.4 lb

## 2017-02-28 DIAGNOSIS — Z950 Presence of cardiac pacemaker: Secondary | ICD-10-CM | POA: Diagnosis not present

## 2017-02-28 DIAGNOSIS — I2510 Atherosclerotic heart disease of native coronary artery without angina pectoris: Secondary | ICD-10-CM | POA: Diagnosis not present

## 2017-02-28 DIAGNOSIS — R002 Palpitations: Secondary | ICD-10-CM | POA: Diagnosis not present

## 2017-02-28 NOTE — Patient Instructions (Signed)

## 2017-02-28 NOTE — Progress Notes (Signed)
[facility] [address], [address] [place], [place]  [address] Phone: [phone] Fax:  [phone]  Date:  02/28/2017   ID:  [name], DOB 1924-09-27, MRN [id number]  PCP:  [name], MD   History of Present Illness: [name] is a 83 y.o. male with hx of pacemaker placed in July 2010 due to heart block. Prior cardiac cath (mild LAD disease of 30-40% stenosis, 75% proximal stenosis of diagonal branch. and medically management) Echocardiogram with normal EF here for clinic follow-up.   Previously he was feeling heart fluttering at approximately 2 AM. Toprol-XL 25 mg has helped out significantly. No recent arrhythmias on pacemaker interrogation.    Wife died 05-Feb-2016 after 66 years of marriage. They have 5 children. All of them are in the ministry.   Overall doing well, no chest pain, no shortness of breath, no syncope, no bleeding. He has felt some mild dizziness at times when he gets up. His blood pressure today is normal. If necessary, we may need to stop his amlodipine.   Wt Readings from Last 3 Encounters:  02/28/17 187 lb 6.4 oz (85 kg)  08/18/16 184 lb (83.5 kg)  02/22/16 176 lb 6.4 oz (80 kg)     Past Medical History:  Diagnosis Date  . Atrioventricular block, complete (HCC)    a. s/p MDT ADDRL1 pacemaker implanted by Dr Verlon Setting  . BPH (benign prostatic hyperplasia)   . CAD (coronary artery disease) 05/02/2013   a. Prior cardiac cath (mild LAD disease of 30-40% stenosis, 75% proximal stenosis of diagonal branch and medically management). hISTORY OF PACEMAKER, NON-OBSTRUCTIVE CAD MEDICALLY STABLE.  . Essential hypertension, benign   . Glaucoma   . Hematuria, unspecified   . History of colon cancer, stage III   . Hyperlipidemia   . Ulcer    DU  . Uveitis     Past Surgical History:  Procedure Laterality Date  . CARDIAC CATHETERIZATION     Prior cardiac cath (mild LAD disease of 30-40% stenosis, 75% proximal stenosis of diagonal branch. and  medically management)  . PACEMAKER INSERTION  11/2008   MDT Adapta L implanted for Complete heart block by Dr Verlon Setting  . PARTIAL COLECTOMY  04/2007  . TRANSURETHRAL RESECTION OF PROSTATE  2002    Current Outpatient Prescriptions  Medication Sig Dispense Refill  . amLODipine (NORVASC) 5 MG tablet TAKE 1 TABLET BY MOUTH  DAILY 90 tablet 2  . aspirin 81 MG tablet Take 81 mg by mouth every other day.     . cholecalciferol (VITAMIN D) 1000 UNITS tablet Take 2,000 Units by mouth daily.    Marland Kitchen latanoprost (XALATAN) 0.005 % ophthalmic solution Place 1 drop into both eyes at bedtime.     . metoprolol succinate (TOPROL-XL) 25 MG 24 hr tablet TAKE 1 TABLET BY MOUTH  DAILY 90 tablet 2  . Multiple Vitamins-Minerals (MULTIVITAMIN PO) Take 1 capsule by mouth every morning.     Marland Kitchen omeprazole (PRILOSEC) 20 MG capsule Take 20 mg by mouth daily.    . simvastatin (ZOCOR) 20 MG tablet TAKE 1 TABLET BY MOUTH  EVERY EVENING 90 tablet 2   No current facility-administered medications for this visit.     Allergies:    Allergies  Allergen Reactions  . Levaquin [Levofloxacin In D5w] Other (See Comments)    DIZZINESS    Social History:  The patient  reports that he has never smoked. He has never used smokeless tobacco. He reports  that he does not drink alcohol or use drugs.   ROS:  Please see the history of present illness.   No syncope, no CP, no SOB. Great exercise tolerance.     PHYSICAL EXAM: VS:  BP 130/82   Pulse 80   Ht 5\' 11"  (1.803 m)   Wt 187 lb 6.4 oz (85 kg)   BMI 26.14 kg/m  GEN: Well nourished, well developed, in no acute distress, Walks with a cane  HEENT: normal  Neck: no JVD, carotid bruits, or masses Cardiac: RRR; no murmurs, rubs, or gallops,no edema  Respiratory:  clear to auscultation bilaterally, normal work of breathing GI: soft, nontender, nondistended, + BS MS: no deformity or atrophy  Skin: warm and dry, no rash Neuro:  Alert and Oriented x 3, Strength and sensation are  intact Psych: euthymic mood, full affect   EKG:  Pacemaker interrogation normal once again LDL 63  ASSESSMENT AND PLAN:  1. Pacemaker/complete heart block-functioning well. MDT, No changes. Stable, prior office visit reviewed 2. Palpitations-improved on metoprolol. Continue. No changes 3. Hyperlipidemia-simvastatin, low-dose. He is having blood work done by Dr. Lysle Rubens. Labs ok. Reviewed on KPN 4. Coronary artery disease-continue with aggressive secondary prevention. Doing well, continue with medical therapy 5. Essential hypertension - controlled. Meds reviewed. HCTZ stopped by Dr. [name] because of dizziness and advanced age on 07/2015. He is doing well. If he continues to have any lightheadedness, we could consider discontinuation of amlodipine. 6. We will see him back in one year.  Signed, Candee Furbish, MD [facility]  02/28/2017 4:36 PM

## 2017-03-22 ENCOUNTER — Telehealth: Payer: Self-pay | Admitting: Cardiology

## 2017-03-22 ENCOUNTER — Encounter: Payer: Medicare PPO | Admitting: *Deleted

## 2017-03-22 NOTE — Telephone Encounter (Signed)
LMOVM reminding pt to send remote transmission.   

## 2017-03-23 ENCOUNTER — Encounter: Payer: Self-pay | Admitting: Cardiology

## 2017-04-13 ENCOUNTER — Ambulatory Visit (INDEPENDENT_AMBULATORY_CARE_PROVIDER_SITE_OTHER): Payer: Medicare PPO | Admitting: *Deleted

## 2017-04-13 DIAGNOSIS — I442 Atrioventricular block, complete: Secondary | ICD-10-CM | POA: Diagnosis not present

## 2017-04-19 ENCOUNTER — Encounter: Payer: Self-pay | Admitting: Cardiology

## 2017-04-19 NOTE — Progress Notes (Signed)
Remote pacemaker transmission.   

## 2017-04-28 ENCOUNTER — Telehealth: Payer: Self-pay | Admitting: Cardiology

## 2017-04-28 LAB — CUP PACEART REMOTE DEVICE CHECK
Battery Voltage: 2.76 V
Brady Statistic AP VS Percent: 0 %
Brady Statistic AS VP Percent: 12 %
Implantable Lead Implant Date: 20100726
Implantable Lead Location: 753860
Implantable Lead Model: 5076
Implantable Lead Model: 5076
Implantable Pulse Generator Implant Date: 20100726
Lead Channel Impedance Value: 514 Ohm
Lead Channel Pacing Threshold Amplitude: 0.5 V
Lead Channel Pacing Threshold Amplitude: 1 V
Lead Channel Pacing Threshold Pulse Width: 0.4 ms
Lead Channel Pacing Threshold Pulse Width: 0.4 ms
MDC IDC LEAD IMPLANT DT: 20100726
MDC IDC LEAD LOCATION: 753859
MDC IDC MSMT BATTERY IMPEDANCE: 1516 Ohm
MDC IDC MSMT BATTERY REMAINING LONGEVITY: 41 mo
MDC IDC MSMT LEADCHNL RA IMPEDANCE VALUE: 572 Ohm
MDC IDC SESS DTM: 20181115175839
MDC IDC SET LEADCHNL RA PACING AMPLITUDE: 2.25 V
MDC IDC SET LEADCHNL RV PACING AMPLITUDE: 2.5 V
MDC IDC SET LEADCHNL RV PACING PULSEWIDTH: 0.4 ms
MDC IDC SET LEADCHNL RV SENSING SENSITIVITY: 2 mV
MDC IDC STAT BRADY AP VP PERCENT: 88 %
MDC IDC STAT BRADY AS VS PERCENT: 0 %

## 2017-04-28 NOTE — Telephone Encounter (Signed)
Spoke with patient's daughter who stated that patient woke up this morning with pain above pacemaker site. She states that patient reports the pain is felt when he turns his breath and exhales. Patient describe the pain as aching. I explained that his symptoms sound muscular in nature - possibly from a sleeping position. I recommended that patient wait a couple of days to see if the pain subsided. Patient and daughter were agreeable to this plan.

## 2017-04-28 NOTE — Telephone Encounter (Signed)
Patient daughter called and stated that pt is experiencing pain when he moves his neck and when he exhales and it started after he went to bed last night. Call routed to [place].

## 2017-06-13 DIAGNOSIS — C189 Malignant neoplasm of colon, unspecified: Secondary | ICD-10-CM | POA: Diagnosis not present

## 2017-06-13 DIAGNOSIS — N182 Chronic kidney disease, stage 2 (mild): Secondary | ICD-10-CM | POA: Diagnosis not present

## 2017-06-13 DIAGNOSIS — I2510 Atherosclerotic heart disease of native coronary artery without angina pectoris: Secondary | ICD-10-CM | POA: Diagnosis not present

## 2017-06-13 DIAGNOSIS — I10 Essential (primary) hypertension: Secondary | ICD-10-CM | POA: Diagnosis not present

## 2017-06-13 DIAGNOSIS — H409 Unspecified glaucoma: Secondary | ICD-10-CM | POA: Diagnosis not present

## 2017-06-13 DIAGNOSIS — E7800 Pure hypercholesterolemia, unspecified: Secondary | ICD-10-CM | POA: Diagnosis not present

## 2017-06-13 DIAGNOSIS — N400 Enlarged prostate without lower urinary tract symptoms: Secondary | ICD-10-CM | POA: Diagnosis not present

## 2017-07-17 ENCOUNTER — Telehealth: Payer: Self-pay | Admitting: Cardiology

## 2017-07-17 ENCOUNTER — Ambulatory Visit (INDEPENDENT_AMBULATORY_CARE_PROVIDER_SITE_OTHER): Payer: Medicare PPO | Admitting: *Deleted

## 2017-07-17 DIAGNOSIS — I442 Atrioventricular block, complete: Secondary | ICD-10-CM

## 2017-07-17 NOTE — Telephone Encounter (Signed)
LMOVM reminding pt to send remote transmission.   

## 2017-07-19 ENCOUNTER — Encounter: Payer: Self-pay | Admitting: Cardiology

## 2017-07-19 NOTE — Progress Notes (Signed)
Remote pacemaker transmission.   

## 2017-07-21 LAB — CUP PACEART REMOTE DEVICE CHECK
Battery Impedance: 1629 Ohm
Battery Voltage: 2.76 V
Brady Statistic AP VP Percent: 88 %
Brady Statistic AP VS Percent: 0 %
Brady Statistic AS VP Percent: 12 %
Brady Statistic AS VS Percent: 0 %
Date Time Interrogation Session: 20190220195145
Implantable Lead Implant Date: 20100726
Implantable Lead Implant Date: 20100726
Implantable Lead Location: 753859
Implantable Lead Model: 5076
Implantable Lead Model: 5076
Lead Channel Impedance Value: 546 Ohm
Lead Channel Impedance Value: 664 Ohm
Lead Channel Pacing Threshold Amplitude: 1.25 V
Lead Channel Pacing Threshold Pulse Width: 0.4 ms
Lead Channel Setting Pacing Amplitude: 2.5 V
Lead Channel Setting Pacing Pulse Width: 0.4 ms
MDC IDC LEAD LOCATION: 753860
MDC IDC MSMT BATTERY REMAINING LONGEVITY: 39 mo
MDC IDC MSMT LEADCHNL RV PACING THRESHOLD AMPLITUDE: 0.5 V
MDC IDC MSMT LEADCHNL RV PACING THRESHOLD PULSEWIDTH: 0.4 ms
MDC IDC PG IMPLANT DT: 20100726
MDC IDC SET LEADCHNL RA PACING AMPLITUDE: 2.5 V
MDC IDC SET LEADCHNL RV SENSING SENSITIVITY: 2 mV

## 2017-08-16 DIAGNOSIS — N400 Enlarged prostate without lower urinary tract symptoms: Secondary | ICD-10-CM | POA: Diagnosis not present

## 2017-08-16 DIAGNOSIS — H409 Unspecified glaucoma: Secondary | ICD-10-CM | POA: Diagnosis not present

## 2017-08-16 DIAGNOSIS — C189 Malignant neoplasm of colon, unspecified: Secondary | ICD-10-CM | POA: Diagnosis not present

## 2017-08-16 DIAGNOSIS — I10 Essential (primary) hypertension: Secondary | ICD-10-CM | POA: Diagnosis not present

## 2017-08-16 DIAGNOSIS — E7800 Pure hypercholesterolemia, unspecified: Secondary | ICD-10-CM | POA: Diagnosis not present

## 2017-08-16 DIAGNOSIS — I2510 Atherosclerotic heart disease of native coronary artery without angina pectoris: Secondary | ICD-10-CM | POA: Diagnosis not present

## 2017-08-16 DIAGNOSIS — N182 Chronic kidney disease, stage 2 (mild): Secondary | ICD-10-CM | POA: Diagnosis not present

## 2017-08-18 ENCOUNTER — Ambulatory Visit: Payer: Medicare PPO | Admitting: Nurse Practitioner

## 2017-08-18 ENCOUNTER — Encounter: Payer: Self-pay | Admitting: Nurse Practitioner

## 2017-08-18 VITALS — BP 130/72 | HR 70 | Ht 71.0 in | Wt 190.0 lb

## 2017-08-18 DIAGNOSIS — I10 Essential (primary) hypertension: Secondary | ICD-10-CM | POA: Diagnosis not present

## 2017-08-18 DIAGNOSIS — I2510 Atherosclerotic heart disease of native coronary artery without angina pectoris: Secondary | ICD-10-CM | POA: Diagnosis not present

## 2017-08-18 DIAGNOSIS — I442 Atrioventricular block, complete: Secondary | ICD-10-CM

## 2017-08-18 NOTE — Patient Instructions (Addendum)
Medication Instructions:   Your physician recommends that you continue on your current medications as directed. Please refer to the Current Medication list given to you today.   If you need a refill on your cardiac medications before your next appointment, please call your pharmacy.  Labwork: NONE ORDERED  TODAY    Testing/Procedures: NONE ORDERED  TODAY    Follow-Up:   Your physician wants you to follow-up in: [place] will receive a reminder letter in the mail two months in advance. If you don't receive a letter, please call our office to schedule the follow-up appointment.  Remote monitoring is used to monitor your Pacemaker of ICD from home. This monitoring reduces the number of office visits required to check your device to one time per year. It allows [place] to keep an eye on the functioning of your device to ensure it is working properly. You are scheduled for a device check from home on .  5-20-19You may send your transmission at any time that day. If you have a wireless device, the transmission will be sent automatically. After your physician reviews your transmission, you will receive a postcard with your next transmission date. '   Any Other Special Instructions Will Be Listed Below (If Applicable).

## 2017-08-18 NOTE — Progress Notes (Signed)
Electrophysiology Office Note Date: 08/18/2017  ID:  [name], DOB 04-07-1925, MRN [id number]  PCP: [name], MD Primary Cardiologist: [name] Electrophysiologist: [name]  CC: Pacemaker follow-up  [name] is a 84 y.o. male seen today for Dr [name].  He presents today for routine electrophysiology followup.  Since last being seen in our clinic, the patient reports doing reasonably well.  He is struggling with vision loss and is having his eye drops adjusted. He also is pending some dental work and asks for clearance today for that.   He denies chest pain, palpitations, dyspnea, nausea, vomiting, or syncope.    Device History: MDT dual chamber PPM implanted 2010 for complete heart block by Dr Verlon Setting, now followed by Dr [name].    Past Medical History:  Diagnosis Date  . Atrioventricular block, complete (HCC)    a. s/p MDT ADDRL1 pacemaker implanted by Dr Verlon Setting  . BPH (benign prostatic hyperplasia)   . CAD (coronary artery disease) 05/02/2013   a. Prior cardiac cath (mild LAD disease of 30-40% stenosis, 75% proximal stenosis of diagonal branch and medically management). hISTORY OF PACEMAKER, NON-OBSTRUCTIVE CAD MEDICALLY STABLE.  . Essential hypertension, benign   . Glaucoma   . Hematuria, unspecified   . History of colon cancer, stage III   . Hyperlipidemia   . Ulcer    DU  . Uveitis    Past Surgical History:  Procedure Laterality Date  . CARDIAC CATHETERIZATION     Prior cardiac cath (mild LAD disease of 30-40% stenosis, 75% proximal stenosis of diagonal branch. and medically management)  . PACEMAKER INSERTION  11/2008   MDT Adapta L implanted for Complete heart block by Dr Verlon Setting  . PARTIAL COLECTOMY  04/2007  . TRANSURETHRAL RESECTION OF PROSTATE  2002    Current Outpatient Medications  Medication Sig Dispense Refill  . amLODipine (NORVASC) 5 MG tablet TAKE 1 TABLET BY MOUTH  DAILY 90 tablet 2  . aspirin 81 MG tablet Take 81 mg by mouth every  other day.     . cholecalciferol (VITAMIN D) 1000 UNITS tablet Take 2,000 Units by mouth daily.    Marland Kitchen latanoprost (XALATAN) 0.005 % ophthalmic solution Place 1 drop into both eyes at bedtime.     . metoprolol succinate (TOPROL-XL) 25 MG 24 hr tablet TAKE 1 TABLET BY MOUTH  DAILY 90 tablet 2  . Multiple Vitamins-Minerals (MULTIVITAMIN PO) Take 1 capsule by mouth every morning.     Marland Kitchen omeprazole (PRILOSEC) 20 MG capsule Take 20 mg by mouth daily.    . simvastatin (ZOCOR) 20 MG tablet TAKE 1 TABLET BY MOUTH  EVERY EVENING 90 tablet 2   No current facility-administered medications for this visit.     Allergies:   Levaquin [levofloxacin in d5w]   Social History: Social History   Socioeconomic History  . Marital status: Married    Spouse name: Not on file  . Number of children: Not on file  . Years of education: Not on file  . Highest education level: Not on file  Occupational History  . Not on file  Social Needs  . Financial resource strain: Not on file  . Food insecurity:    Worry: Not on file    Inability: Not on file  . Transportation needs:    Medical: Not on file    Non-medical: Not on file  Tobacco Use  . Smoking status: Never Smoker  . Smokeless tobacco: Never Used  Substance and Sexual Activity  .  Alcohol use: No    Alcohol/week: 0.0 oz  . Drug use: No  . Sexual activity: Not on file  Lifestyle  . Physical activity:    Days per week: Not on file    Minutes per session: Not on file  . Stress: Not on file  Relationships  . Social connections:    Talks on phone: Not on file    Gets together: Not on file    Attends religious service: Not on file    Active member of club or organization: Not on file    Attends meetings of clubs or organizations: Not on file    Relationship status: Not on file  . Intimate partner violence:    Fear of current or ex partner: Not on file    Emotionally abused: Not on file    Physically abused: Not on file    Forced sexual activity:  Not on file  Other Topics Concern  . Not on file  Social History Narrative  . Not on file    Family History: Family History  Problem Relation Age of Onset  . Heart disease Mother   . CAD Mother   . Heart disease Father   . CAD Father   . Heart disease Brother   . CAD Brother   . Diabetes Sister   . Gout Brother   . Alcohol abuse Brother   . Liver disease Brother      Review of Systems: All other systems reviewed and are otherwise negative except as noted above.   Physical Exam: VS:  BP 130/72 (BP Location: Right Arm, Patient Position: Sitting, Cuff Size: Normal)   Pulse 70   Ht 5\' 11"  (1.803 m)   Wt 190 lb (86.2 kg)   SpO2 94%   BMI 26.50 kg/m  , BMI Body mass index is 26.5 kg/m.  GEN- The patient is elderly appearing, alert and oriented x 3 today.   HEENT: normocephalic, atraumatic; sclera clear, conjunctiva pink; hearing intact; oropharynx clear; neck supple Lungs- Clear to ausculation bilaterally, normal work of breathing.  No wheezes, rales, rhonchi Heart- Regular rate and rhythm (paced) GI- soft, non-tender, non-distended, bowel sounds present Extremities- no clubbing, cyanosis, or edema  MS- no significant deformity or atrophy Skin- warm and dry, no rash or lesion; PPM pocket well healed Psych- euthymic mood, full affect Neuro- strength and sensation are intact  PPM Interrogation- reviewed in detail today,  See PACEART report  EKG:  EKG is not ordered today.  Recent Labs: No results found for requested labs within last 8760 hours.   Wt Readings from Last 3 Encounters:  08/18/17 190 lb (86.2 kg)  02/28/17 187 lb 6.4 oz (85 kg)  08/18/16 184 lb (83.5 kg)     Other studies Reviewed: Additional studies/ records that were reviewed today include: Dr [name] and Dr [name]' prior office notes   Assessment and Plan:  1.  Complete heart block Normal PPM function - patient is device dependent today  See Pace Art report No changes today  2.   Hypertension Stable No change required today  3.  CAD No recent ischemic symptoms  4.  Dental clearance Dentist would like to use piezo ultrasonics. Discussed with [employer] tech services today, no concern for interference.    Current medicines are reviewed at length with the patient today.   The patient does not have concerns regarding his medicines.  The following changes were made today:  none  Labs/ tests ordered today include: none No  orders of the defined types were placed in this encounter.    Disposition:   Follow up with Dr [name] as scheduled.  [facility] every 3 months, follow up with me in 1 year    Signed, Chanetta Marshall, NP 08/18/2017 12:21 PM  [place] [address] [phone] (office) [phone] (fax)

## 2017-08-21 LAB — CUP PACEART INCLINIC DEVICE CHECK
Date Time Interrogation Session: 20190325101937
Implantable Lead Location: 753859
Implantable Lead Model: 5076
MDC IDC LEAD IMPLANT DT: 20100726
MDC IDC LEAD IMPLANT DT: 20100726
MDC IDC LEAD LOCATION: 753860
MDC IDC PG IMPLANT DT: 20100726

## 2017-10-03 ENCOUNTER — Telehealth: Payer: Self-pay | Admitting: Cardiology

## 2017-10-03 NOTE — Telephone Encounter (Signed)
New Message   Pt c/o medication issue:  1. Name of Medication: pain medication  2. How are you currently taking this medication (dosage and times per day)? n/a  3. Are you having a reaction (difficulty breathing--STAT)? n/a  4. What is your medication issue? Pt's daughter is calling because the pt had his teeth pulled was told to contact his heart doctor for a recommendation on pain medication. Please call

## 2017-10-03 NOTE — Telephone Encounter (Signed)
Spoke with pt's daughter regarding recommendation for OTC pain medication after having a tooth pulled. His dentist advised her to contact his cardiologist. I advised her we do not usually recommend on pain medication, but tylenol would be the safest pain medication, however to keep his dose to under 3000mg  per day. Ibuprofen could increase his BP, so if he absolutely needed it to keep his dosage under 400mg  q8hr. She verbalized understanding, had no additional questions.

## 2017-10-16 ENCOUNTER — Telehealth: Payer: Self-pay | Admitting: Cardiology

## 2017-10-16 ENCOUNTER — Encounter: Payer: Medicare PPO | Admitting: *Deleted

## 2017-10-16 NOTE — Telephone Encounter (Signed)
LMOVM reminding pt to send remote transmission.   

## 2017-10-19 ENCOUNTER — Encounter: Payer: Self-pay | Admitting: Cardiology

## 2017-10-31 DIAGNOSIS — H401132 Primary open-angle glaucoma, bilateral, moderate stage: Secondary | ICD-10-CM | POA: Diagnosis not present

## 2017-11-14 ENCOUNTER — Encounter: Payer: Self-pay | Admitting: Cardiology

## 2017-11-22 ENCOUNTER — Ambulatory Visit (INDEPENDENT_AMBULATORY_CARE_PROVIDER_SITE_OTHER): Payer: Medicare PPO | Admitting: *Deleted

## 2017-11-22 DIAGNOSIS — I10 Essential (primary) hypertension: Secondary | ICD-10-CM | POA: Diagnosis not present

## 2017-11-22 DIAGNOSIS — I442 Atrioventricular block, complete: Secondary | ICD-10-CM

## 2017-11-22 DIAGNOSIS — N182 Chronic kidney disease, stage 2 (mild): Secondary | ICD-10-CM | POA: Diagnosis not present

## 2017-11-22 NOTE — Progress Notes (Signed)
Remote pacemaker transmission.   

## 2017-11-23 ENCOUNTER — Encounter: Payer: Self-pay | Admitting: Cardiology

## 2017-11-23 LAB — CUP PACEART REMOTE DEVICE CHECK
Brady Statistic AS VS Percent: 0 %
Date Time Interrogation Session: 20190626150224
Implantable Lead Implant Date: 20100726
Implantable Lead Location: 753860
Implantable Pulse Generator Implant Date: 20100726
Lead Channel Impedance Value: 538 Ohm
Lead Channel Pacing Threshold Amplitude: 0.5 V
Lead Channel Pacing Threshold Amplitude: 1.5 V
Lead Channel Setting Sensing Sensitivity: 2 mV
MDC IDC LEAD IMPLANT DT: 20100726
MDC IDC LEAD LOCATION: 753859
MDC IDC MSMT BATTERY IMPEDANCE: 1827 Ohm
MDC IDC MSMT BATTERY REMAINING LONGEVITY: 31 mo
MDC IDC MSMT BATTERY VOLTAGE: 2.75 V
MDC IDC MSMT LEADCHNL RA IMPEDANCE VALUE: 701 Ohm
MDC IDC MSMT LEADCHNL RA PACING THRESHOLD PULSEWIDTH: 0.4 ms
MDC IDC MSMT LEADCHNL RV PACING THRESHOLD PULSEWIDTH: 0.4 ms
MDC IDC SET LEADCHNL RA PACING AMPLITUDE: 3 V
MDC IDC SET LEADCHNL RV PACING AMPLITUDE: 2.5 V
MDC IDC SET LEADCHNL RV PACING PULSEWIDTH: 0.4 ms
MDC IDC STAT BRADY AP VP PERCENT: 90 %
MDC IDC STAT BRADY AP VS PERCENT: 0 %
MDC IDC STAT BRADY AS VP PERCENT: 10 %

## 2018-02-05 NOTE — Progress Notes (Deleted)
Electrophysiology Office Note Date: 02/05/2018  ID:  [name], DOB Oct 05, 1924, MRN [id number]  PCP: [name], MD Primary Cardiologist: [name] Electrophysiologist: [name]  CC: Pacemaker follow-up  [name] is a 84 y.o. male seen today for Dr [name].  He presents today for routine electrophysiology followup.  Since last being seen in our clinic, the patient reports doing very well.  He denies chest pain, palpitations, dyspnea, PND, orthopnea, nausea, vomiting, dizziness, syncope, edema, weight gain, or early satiety.  Device History: MDT dual chamber PPM implanted 2010 for complete heart block by Dr Verlon Setting, now followed by Dr [name].   Past Medical History:  Diagnosis Date  . Atrioventricular block, complete (HCC)    a. s/p MDT ADDRL1 pacemaker implanted by Dr Verlon Setting  . BPH (benign prostatic hyperplasia)   . CAD (coronary artery disease) 05/02/2013   a. Prior cardiac cath (mild LAD disease of 30-40% stenosis, 75% proximal stenosis of diagonal branch and medically management). hISTORY OF PACEMAKER, NON-OBSTRUCTIVE CAD MEDICALLY STABLE.  . Essential hypertension, benign   . Glaucoma   . Hematuria, unspecified   . History of colon cancer, stage III   . Hyperlipidemia   . Ulcer    DU  . Uveitis    Past Surgical History:  Procedure Laterality Date  . CARDIAC CATHETERIZATION     Prior cardiac cath (mild LAD disease of 30-40% stenosis, 75% proximal stenosis of diagonal branch. and medically management)  . PACEMAKER INSERTION  11/2008   MDT Adapta L implanted for Complete heart block by Dr Verlon Setting  . PARTIAL COLECTOMY  04/2007  . TRANSURETHRAL RESECTION OF PROSTATE  2002    Current Outpatient Medications  Medication Sig Dispense Refill  . amLODipine (NORVASC) 5 MG tablet TAKE 1 TABLET BY MOUTH  DAILY 90 tablet 2  . aspirin 81 MG tablet Take 81 mg by mouth every other day.     . cholecalciferol (VITAMIN D) 1000 UNITS tablet Take 2,000 Units by mouth daily.      Marland Kitchen latanoprost (XALATAN) 0.005 % ophthalmic solution Place 1 drop into both eyes at bedtime.     . metoprolol succinate (TOPROL-XL) 25 MG 24 hr tablet TAKE 1 TABLET BY MOUTH  DAILY 90 tablet 2  . Multiple Vitamins-Minerals (MULTIVITAMIN PO) Take 1 capsule by mouth every morning.     Marland Kitchen omeprazole (PRILOSEC) 20 MG capsule Take 20 mg by mouth daily.    . simvastatin (ZOCOR) 20 MG tablet TAKE 1 TABLET BY MOUTH  EVERY EVENING 90 tablet 2   No current facility-administered medications for this visit.     Allergies:   Levaquin [levofloxacin in d5w]   Social History: Social History   Socioeconomic History  . Marital status: Married    Spouse name: Not on file  . Number of children: Not on file  . Years of education: Not on file  . Highest education level: Not on file  Occupational History  . Not on file  Social Needs  . Financial resource strain: Not on file  . Food insecurity:    Worry: Not on file    Inability: Not on file  . Transportation needs:    Medical: Not on file    Non-medical: Not on file  Tobacco Use  . Smoking status: Never Smoker  . Smokeless tobacco: Never Used  Substance and Sexual Activity  . Alcohol use: No    Alcohol/week: 0.0 standard drinks  . Drug use: No  . Sexual activity: Not on file  Lifestyle  . Physical activity:    Days per week: Not on file    Minutes per session: Not on file  . Stress: Not on file  Relationships  . Social connections:    Talks on phone: Not on file    Gets together: Not on file    Attends religious service: Not on file    Active member of club or organization: Not on file    Attends meetings of clubs or organizations: Not on file    Relationship status: Not on file  . Intimate partner violence:    Fear of current or ex partner: Not on file    Emotionally abused: Not on file    Physically abused: Not on file    Forced sexual activity: Not on file  Other Topics Concern  . Not on file  Social History Narrative  . Not on  file    Family History: Family History  Problem Relation Age of Onset  . Heart disease Mother   . CAD Mother   . Heart disease Father   . CAD Father   . Heart disease Brother   . CAD Brother   . Diabetes Sister   . Gout Brother   . Alcohol abuse Brother   . Liver disease Brother      Review of Systems: All other systems reviewed and are otherwise negative except as noted above.   Physical Exam: VS:  There were no vitals taken for this visit. , BMI There is no height or weight on file to calculate BMI.  GEN- The patient is well appearing, alert and oriented x 3 today.   HEENT: normocephalic, atraumatic; sclera clear, conjunctiva pink; hearing intact; oropharynx clear; neck supple  Lungs- Clear to ausculation bilaterally, normal work of breathing.  No wheezes, rales, rhonchi Heart- Regular rate and rhythm, no murmurs, rubs or gallops  GI- soft, non-tender, non-distended, bowel sounds present  Extremities- no clubbing, cyanosis, or edema  MS- no significant deformity or atrophy Skin- warm and dry, no rash or lesion; PPM pocket well healed Psych- euthymic mood, full affect Neuro- strength and sensation are intact  PPM Interrogation- reviewed in detail today,  See PACEART report  EKG:  EKG is not ordered today.  Recent Labs: No results found for requested labs within last 8760 hours.   Wt Readings from Last 3 Encounters:  08/18/17 190 lb (86.2 kg)  02/28/17 187 lb 6.4 oz (85 kg)  08/18/16 184 lb (83.5 kg)     Assessment and Plan:  1.  Complete heart block Normal PPM function - pt is device dependent today See Pace Art report No changes today  2.  HTN Stable No change required today  3.  CAD No recent ischemic symptoms Continue medical therapy   Current medicines are reviewed at length with the patient today.   The patient does not have concerns regarding his medicines.  The following changes were made today:  none  Labs/ tests ordered today include:  none No orders of the defined types were placed in this encounter.    Disposition:   Follow up with [facility], me in 1 year    Signed, Chanetta Marshall, NP 02/05/2018 8:45 AM  [place] [address] [phone] (office) [phone] (fax)

## 2018-02-07 ENCOUNTER — Encounter: Payer: Medicare PPO | Admitting: Nurse Practitioner

## 2018-02-21 ENCOUNTER — Telehealth: Payer: Self-pay

## 2018-02-21 ENCOUNTER — Encounter: Payer: Medicare PPO | Admitting: *Deleted

## 2018-02-21 NOTE — Telephone Encounter (Signed)
LMOVM reminding pt to send remote transmission.   

## 2018-02-23 ENCOUNTER — Encounter: Payer: Self-pay | Admitting: Cardiology

## 2018-02-28 ENCOUNTER — Encounter: Payer: Self-pay | Admitting: Nurse Practitioner

## 2018-03-08 ENCOUNTER — Ambulatory Visit (INDEPENDENT_AMBULATORY_CARE_PROVIDER_SITE_OTHER): Payer: Medicare PPO | Admitting: *Deleted

## 2018-03-08 DIAGNOSIS — I442 Atrioventricular block, complete: Secondary | ICD-10-CM

## 2018-03-12 NOTE — Progress Notes (Signed)
Remote pacemaker transmission.   

## 2018-03-15 ENCOUNTER — Encounter: Payer: Self-pay | Admitting: Cardiology

## 2018-04-11 DIAGNOSIS — J069 Acute upper respiratory infection, unspecified: Secondary | ICD-10-CM | POA: Diagnosis not present

## 2018-04-17 LAB — CUP PACEART REMOTE DEVICE CHECK
Battery Impedance: 2004 Ohm
Battery Remaining Longevity: 27 mo
Brady Statistic AP VP Percent: 89 %
Brady Statistic AS VP Percent: 11 %
Implantable Lead Implant Date: 20100726
Implantable Lead Location: 753859
Implantable Lead Model: 5076
Implantable Lead Model: 5076
Implantable Pulse Generator Implant Date: 20100726
Lead Channel Impedance Value: 689 Ohm
Lead Channel Pacing Threshold Amplitude: 1.75 V
Lead Channel Setting Pacing Amplitude: 2.5 V
Lead Channel Setting Pacing Amplitude: 3.5 V
Lead Channel Setting Pacing Pulse Width: 0.4 ms
Lead Channel Setting Sensing Sensitivity: 2 mV
MDC IDC LEAD IMPLANT DT: 20100726
MDC IDC LEAD LOCATION: 753860
MDC IDC MSMT BATTERY VOLTAGE: 2.74 V
MDC IDC MSMT LEADCHNL RA PACING THRESHOLD PULSEWIDTH: 0.4 ms
MDC IDC MSMT LEADCHNL RV IMPEDANCE VALUE: 501 Ohm
MDC IDC MSMT LEADCHNL RV PACING THRESHOLD AMPLITUDE: 0.5 V
MDC IDC MSMT LEADCHNL RV PACING THRESHOLD PULSEWIDTH: 0.4 ms
MDC IDC SESS DTM: 20191010180322
MDC IDC STAT BRADY AP VS PERCENT: 0 %
MDC IDC STAT BRADY AS VS PERCENT: 0 %

## 2018-05-09 DIAGNOSIS — H401132 Primary open-angle glaucoma, bilateral, moderate stage: Secondary | ICD-10-CM | POA: Diagnosis not present

## 2018-05-15 DIAGNOSIS — N400 Enlarged prostate without lower urinary tract symptoms: Secondary | ICD-10-CM | POA: Diagnosis not present

## 2018-05-15 DIAGNOSIS — N182 Chronic kidney disease, stage 2 (mild): Secondary | ICD-10-CM | POA: Diagnosis not present

## 2018-05-15 DIAGNOSIS — K219 Gastro-esophageal reflux disease without esophagitis: Secondary | ICD-10-CM | POA: Diagnosis not present

## 2018-05-15 DIAGNOSIS — H409 Unspecified glaucoma: Secondary | ICD-10-CM | POA: Diagnosis not present

## 2018-05-15 DIAGNOSIS — Z950 Presence of cardiac pacemaker: Secondary | ICD-10-CM | POA: Diagnosis not present

## 2018-05-15 DIAGNOSIS — Z0000 Encounter for general adult medical examination without abnormal findings: Secondary | ICD-10-CM | POA: Diagnosis not present

## 2018-05-15 DIAGNOSIS — I2510 Atherosclerotic heart disease of native coronary artery without angina pectoris: Secondary | ICD-10-CM | POA: Diagnosis not present

## 2018-05-15 DIAGNOSIS — E7800 Pure hypercholesterolemia, unspecified: Secondary | ICD-10-CM | POA: Diagnosis not present

## 2018-05-15 DIAGNOSIS — I10 Essential (primary) hypertension: Secondary | ICD-10-CM | POA: Diagnosis not present

## 2018-05-15 DIAGNOSIS — R7301 Impaired fasting glucose: Secondary | ICD-10-CM | POA: Diagnosis not present

## 2018-05-15 DIAGNOSIS — Z1389 Encounter for screening for other disorder: Secondary | ICD-10-CM | POA: Diagnosis not present

## 2018-05-15 DIAGNOSIS — Z23 Encounter for immunization: Secondary | ICD-10-CM | POA: Diagnosis not present

## 2018-06-04 DIAGNOSIS — G309 Alzheimer's disease, unspecified: Secondary | ICD-10-CM | POA: Diagnosis not present

## 2018-06-04 DIAGNOSIS — H8110 Benign paroxysmal vertigo, unspecified ear: Secondary | ICD-10-CM | POA: Diagnosis not present

## 2018-06-04 DIAGNOSIS — D649 Anemia, unspecified: Secondary | ICD-10-CM | POA: Diagnosis not present

## 2018-06-04 DIAGNOSIS — E1169 Type 2 diabetes mellitus with other specified complication: Secondary | ICD-10-CM | POA: Diagnosis not present

## 2018-06-04 DIAGNOSIS — I10 Essential (primary) hypertension: Secondary | ICD-10-CM | POA: Diagnosis not present

## 2018-06-04 DIAGNOSIS — N183 Chronic kidney disease, stage 3 (moderate): Secondary | ICD-10-CM | POA: Diagnosis not present

## 2018-06-04 DIAGNOSIS — Z950 Presence of cardiac pacemaker: Secondary | ICD-10-CM | POA: Diagnosis not present

## 2018-06-07 ENCOUNTER — Ambulatory Visit (INDEPENDENT_AMBULATORY_CARE_PROVIDER_SITE_OTHER): Payer: Medicare PPO

## 2018-06-07 DIAGNOSIS — I442 Atrioventricular block, complete: Secondary | ICD-10-CM | POA: Diagnosis not present

## 2018-06-08 ENCOUNTER — Encounter: Payer: Self-pay | Admitting: Cardiology

## 2018-06-09 LAB — CUP PACEART REMOTE DEVICE CHECK
Battery Remaining Longevity: 30 mo
Brady Statistic AP VS Percent: 0 %
Brady Statistic AS VP Percent: 10 %
Date Time Interrogation Session: 20200110223622
Implantable Lead Implant Date: 20100726
Implantable Lead Location: 753860
Implantable Lead Model: 5076
Lead Channel Pacing Threshold Amplitude: 0.5 V
Lead Channel Pacing Threshold Amplitude: 1.25 V
Lead Channel Pacing Threshold Pulse Width: 0.4 ms
Lead Channel Pacing Threshold Pulse Width: 0.4 ms
Lead Channel Setting Pacing Amplitude: 2.5 V
Lead Channel Setting Pacing Pulse Width: 0.4 ms
MDC IDC LEAD IMPLANT DT: 20100726
MDC IDC LEAD LOCATION: 753859
MDC IDC MSMT BATTERY IMPEDANCE: 2013 Ohm
MDC IDC MSMT BATTERY VOLTAGE: 2.75 V
MDC IDC MSMT LEADCHNL RA IMPEDANCE VALUE: 553 Ohm
MDC IDC MSMT LEADCHNL RV IMPEDANCE VALUE: 511 Ohm
MDC IDC PG IMPLANT DT: 20100726
MDC IDC SET LEADCHNL RV PACING AMPLITUDE: 2.5 V
MDC IDC SET LEADCHNL RV SENSING SENSITIVITY: 2 mV
MDC IDC STAT BRADY AP VP PERCENT: 90 %
MDC IDC STAT BRADY AS VS PERCENT: 0 %

## 2018-06-11 NOTE — Progress Notes (Addendum)
Remote pacemaker transmission.   

## 2018-07-09 DIAGNOSIS — H903 Sensorineural hearing loss, bilateral: Secondary | ICD-10-CM | POA: Diagnosis not present

## 2018-07-09 DIAGNOSIS — R42 Dizziness and giddiness: Secondary | ICD-10-CM | POA: Diagnosis not present

## 2018-07-30 DIAGNOSIS — R42 Dizziness and giddiness: Secondary | ICD-10-CM | POA: Diagnosis not present

## 2018-07-30 DIAGNOSIS — M6281 Muscle weakness (generalized): Secondary | ICD-10-CM | POA: Diagnosis not present

## 2018-08-01 DIAGNOSIS — M6281 Muscle weakness (generalized): Secondary | ICD-10-CM | POA: Diagnosis not present

## 2018-08-01 DIAGNOSIS — R42 Dizziness and giddiness: Secondary | ICD-10-CM | POA: Diagnosis not present

## 2018-08-03 DIAGNOSIS — M6281 Muscle weakness (generalized): Secondary | ICD-10-CM | POA: Diagnosis not present

## 2018-08-03 DIAGNOSIS — R42 Dizziness and giddiness: Secondary | ICD-10-CM | POA: Diagnosis not present

## 2018-08-08 DIAGNOSIS — M6281 Muscle weakness (generalized): Secondary | ICD-10-CM | POA: Diagnosis not present

## 2018-08-08 DIAGNOSIS — R42 Dizziness and giddiness: Secondary | ICD-10-CM | POA: Diagnosis not present

## 2018-09-06 ENCOUNTER — Other Ambulatory Visit: Payer: Self-pay

## 2018-09-06 ENCOUNTER — Encounter: Payer: Medicare PPO | Admitting: *Deleted

## 2018-09-06 ENCOUNTER — Telehealth: Payer: Self-pay

## 2018-09-06 NOTE — Telephone Encounter (Signed)
Left message for patient to remind of missed remote transmission.  

## 2018-09-14 ENCOUNTER — Encounter: Payer: Self-pay | Admitting: Cardiology

## 2018-11-16 DIAGNOSIS — H903 Sensorineural hearing loss, bilateral: Secondary | ICD-10-CM | POA: Diagnosis not present

## 2018-11-19 DIAGNOSIS — H903 Sensorineural hearing loss, bilateral: Secondary | ICD-10-CM | POA: Diagnosis not present

## 2018-11-19 DIAGNOSIS — R42 Dizziness and giddiness: Secondary | ICD-10-CM | POA: Diagnosis not present

## 2018-11-19 DIAGNOSIS — I10 Essential (primary) hypertension: Secondary | ICD-10-CM | POA: Diagnosis not present

## 2018-11-19 DIAGNOSIS — J189 Pneumonia, unspecified organism: Secondary | ICD-10-CM | POA: Diagnosis not present

## 2018-11-19 DIAGNOSIS — H81393 Other peripheral vertigo, bilateral: Secondary | ICD-10-CM | POA: Diagnosis not present

## 2019-01-01 ENCOUNTER — Telehealth: Payer: Self-pay | Admitting: Cardiology

## 2019-01-01 NOTE — Telephone Encounter (Signed)
Encounter not needed

## 2019-01-28 DIAGNOSIS — G309 Alzheimer's disease, unspecified: Secondary | ICD-10-CM | POA: Diagnosis not present

## 2019-01-28 DIAGNOSIS — I10 Essential (primary) hypertension: Secondary | ICD-10-CM | POA: Diagnosis not present

## 2019-01-28 DIAGNOSIS — R2681 Unsteadiness on feet: Secondary | ICD-10-CM | POA: Diagnosis not present

## 2019-01-28 DIAGNOSIS — R05 Cough: Secondary | ICD-10-CM | POA: Diagnosis not present

## 2019-02-01 DIAGNOSIS — I10 Essential (primary) hypertension: Secondary | ICD-10-CM | POA: Diagnosis not present

## 2019-02-01 DIAGNOSIS — F419 Anxiety disorder, unspecified: Secondary | ICD-10-CM | POA: Diagnosis not present

## 2019-02-01 DIAGNOSIS — F0280 Dementia in other diseases classified elsewhere without behavioral disturbance: Secondary | ICD-10-CM | POA: Diagnosis not present

## 2019-02-01 DIAGNOSIS — R627 Adult failure to thrive: Secondary | ICD-10-CM | POA: Diagnosis not present

## 2019-02-01 DIAGNOSIS — Z9181 History of falling: Secondary | ICD-10-CM | POA: Diagnosis not present

## 2019-02-01 DIAGNOSIS — G309 Alzheimer's disease, unspecified: Secondary | ICD-10-CM | POA: Diagnosis not present

## 2019-02-01 DIAGNOSIS — H409 Unspecified glaucoma: Secondary | ICD-10-CM | POA: Diagnosis not present

## 2019-02-05 DIAGNOSIS — H409 Unspecified glaucoma: Secondary | ICD-10-CM | POA: Diagnosis not present

## 2019-02-05 DIAGNOSIS — R627 Adult failure to thrive: Secondary | ICD-10-CM | POA: Diagnosis not present

## 2019-02-05 DIAGNOSIS — I10 Essential (primary) hypertension: Secondary | ICD-10-CM | POA: Diagnosis not present

## 2019-02-05 DIAGNOSIS — F0280 Dementia in other diseases classified elsewhere without behavioral disturbance: Secondary | ICD-10-CM | POA: Diagnosis not present

## 2019-02-05 DIAGNOSIS — F419 Anxiety disorder, unspecified: Secondary | ICD-10-CM | POA: Diagnosis not present

## 2019-02-05 DIAGNOSIS — G309 Alzheimer's disease, unspecified: Secondary | ICD-10-CM | POA: Diagnosis not present

## 2019-02-05 DIAGNOSIS — Z9181 History of falling: Secondary | ICD-10-CM | POA: Diagnosis not present

## 2019-02-08 DIAGNOSIS — F419 Anxiety disorder, unspecified: Secondary | ICD-10-CM | POA: Diagnosis not present

## 2019-02-08 DIAGNOSIS — I10 Essential (primary) hypertension: Secondary | ICD-10-CM | POA: Diagnosis not present

## 2019-02-08 DIAGNOSIS — Z9181 History of falling: Secondary | ICD-10-CM | POA: Diagnosis not present

## 2019-02-08 DIAGNOSIS — G309 Alzheimer's disease, unspecified: Secondary | ICD-10-CM | POA: Diagnosis not present

## 2019-02-08 DIAGNOSIS — F0280 Dementia in other diseases classified elsewhere without behavioral disturbance: Secondary | ICD-10-CM | POA: Diagnosis not present

## 2019-02-08 DIAGNOSIS — R627 Adult failure to thrive: Secondary | ICD-10-CM | POA: Diagnosis not present

## 2019-02-08 DIAGNOSIS — H409 Unspecified glaucoma: Secondary | ICD-10-CM | POA: Diagnosis not present

## 2019-02-11 DIAGNOSIS — Z9181 History of falling: Secondary | ICD-10-CM | POA: Diagnosis not present

## 2019-02-11 DIAGNOSIS — I10 Essential (primary) hypertension: Secondary | ICD-10-CM | POA: Diagnosis not present

## 2019-02-11 DIAGNOSIS — H409 Unspecified glaucoma: Secondary | ICD-10-CM | POA: Diagnosis not present

## 2019-02-11 DIAGNOSIS — G309 Alzheimer's disease, unspecified: Secondary | ICD-10-CM | POA: Diagnosis not present

## 2019-02-11 DIAGNOSIS — R627 Adult failure to thrive: Secondary | ICD-10-CM | POA: Diagnosis not present

## 2019-02-11 DIAGNOSIS — F419 Anxiety disorder, unspecified: Secondary | ICD-10-CM | POA: Diagnosis not present

## 2019-02-11 DIAGNOSIS — F0280 Dementia in other diseases classified elsewhere without behavioral disturbance: Secondary | ICD-10-CM | POA: Diagnosis not present

## 2019-02-12 ENCOUNTER — Encounter: Payer: Medicare PPO | Admitting: Nurse Practitioner

## 2019-02-15 DIAGNOSIS — R627 Adult failure to thrive: Secondary | ICD-10-CM | POA: Diagnosis not present

## 2019-02-15 DIAGNOSIS — I10 Essential (primary) hypertension: Secondary | ICD-10-CM | POA: Diagnosis not present

## 2019-02-15 DIAGNOSIS — H409 Unspecified glaucoma: Secondary | ICD-10-CM | POA: Diagnosis not present

## 2019-02-15 DIAGNOSIS — F419 Anxiety disorder, unspecified: Secondary | ICD-10-CM | POA: Diagnosis not present

## 2019-02-15 DIAGNOSIS — Z9181 History of falling: Secondary | ICD-10-CM | POA: Diagnosis not present

## 2019-02-15 DIAGNOSIS — F0280 Dementia in other diseases classified elsewhere without behavioral disturbance: Secondary | ICD-10-CM | POA: Diagnosis not present

## 2019-02-15 DIAGNOSIS — G309 Alzheimer's disease, unspecified: Secondary | ICD-10-CM | POA: Diagnosis not present

## 2019-02-20 DIAGNOSIS — H401132 Primary open-angle glaucoma, bilateral, moderate stage: Secondary | ICD-10-CM | POA: Diagnosis not present

## 2019-02-20 DIAGNOSIS — H409 Unspecified glaucoma: Secondary | ICD-10-CM | POA: Diagnosis not present

## 2019-02-20 DIAGNOSIS — I10 Essential (primary) hypertension: Secondary | ICD-10-CM | POA: Diagnosis not present

## 2019-02-20 DIAGNOSIS — F0280 Dementia in other diseases classified elsewhere without behavioral disturbance: Secondary | ICD-10-CM | POA: Diagnosis not present

## 2019-02-20 DIAGNOSIS — R627 Adult failure to thrive: Secondary | ICD-10-CM | POA: Diagnosis not present

## 2019-02-20 DIAGNOSIS — F419 Anxiety disorder, unspecified: Secondary | ICD-10-CM | POA: Diagnosis not present

## 2019-02-20 DIAGNOSIS — G309 Alzheimer's disease, unspecified: Secondary | ICD-10-CM | POA: Diagnosis not present

## 2019-02-20 DIAGNOSIS — Z9181 History of falling: Secondary | ICD-10-CM | POA: Diagnosis not present

## 2019-02-22 DIAGNOSIS — F0280 Dementia in other diseases classified elsewhere without behavioral disturbance: Secondary | ICD-10-CM | POA: Diagnosis not present

## 2019-02-22 DIAGNOSIS — G309 Alzheimer's disease, unspecified: Secondary | ICD-10-CM | POA: Diagnosis not present

## 2019-02-22 DIAGNOSIS — I10 Essential (primary) hypertension: Secondary | ICD-10-CM | POA: Diagnosis not present

## 2019-02-22 DIAGNOSIS — R627 Adult failure to thrive: Secondary | ICD-10-CM | POA: Diagnosis not present

## 2019-02-22 DIAGNOSIS — Z9181 History of falling: Secondary | ICD-10-CM | POA: Diagnosis not present

## 2019-02-22 DIAGNOSIS — H409 Unspecified glaucoma: Secondary | ICD-10-CM | POA: Diagnosis not present

## 2019-02-22 DIAGNOSIS — F419 Anxiety disorder, unspecified: Secondary | ICD-10-CM | POA: Diagnosis not present

## 2019-02-25 DIAGNOSIS — F0280 Dementia in other diseases classified elsewhere without behavioral disturbance: Secondary | ICD-10-CM | POA: Diagnosis not present

## 2019-02-25 DIAGNOSIS — I10 Essential (primary) hypertension: Secondary | ICD-10-CM | POA: Diagnosis not present

## 2019-02-25 DIAGNOSIS — F419 Anxiety disorder, unspecified: Secondary | ICD-10-CM | POA: Diagnosis not present

## 2019-02-25 DIAGNOSIS — R627 Adult failure to thrive: Secondary | ICD-10-CM | POA: Diagnosis not present

## 2019-02-25 DIAGNOSIS — G309 Alzheimer's disease, unspecified: Secondary | ICD-10-CM | POA: Diagnosis not present

## 2019-02-25 DIAGNOSIS — H409 Unspecified glaucoma: Secondary | ICD-10-CM | POA: Diagnosis not present

## 2019-02-25 DIAGNOSIS — Z9181 History of falling: Secondary | ICD-10-CM | POA: Diagnosis not present

## 2019-03-01 DIAGNOSIS — R627 Adult failure to thrive: Secondary | ICD-10-CM | POA: Diagnosis not present

## 2019-03-01 DIAGNOSIS — G309 Alzheimer's disease, unspecified: Secondary | ICD-10-CM | POA: Diagnosis not present

## 2019-03-01 DIAGNOSIS — H409 Unspecified glaucoma: Secondary | ICD-10-CM | POA: Diagnosis not present

## 2019-03-01 DIAGNOSIS — F0280 Dementia in other diseases classified elsewhere without behavioral disturbance: Secondary | ICD-10-CM | POA: Diagnosis not present

## 2019-03-01 DIAGNOSIS — I10 Essential (primary) hypertension: Secondary | ICD-10-CM | POA: Diagnosis not present

## 2019-03-01 DIAGNOSIS — Z9181 History of falling: Secondary | ICD-10-CM | POA: Diagnosis not present

## 2019-03-01 DIAGNOSIS — F419 Anxiety disorder, unspecified: Secondary | ICD-10-CM | POA: Diagnosis not present

## 2019-03-05 DIAGNOSIS — R627 Adult failure to thrive: Secondary | ICD-10-CM | POA: Diagnosis not present

## 2019-03-05 DIAGNOSIS — H409 Unspecified glaucoma: Secondary | ICD-10-CM | POA: Diagnosis not present

## 2019-03-05 DIAGNOSIS — F0280 Dementia in other diseases classified elsewhere without behavioral disturbance: Secondary | ICD-10-CM | POA: Diagnosis not present

## 2019-03-05 DIAGNOSIS — F419 Anxiety disorder, unspecified: Secondary | ICD-10-CM | POA: Diagnosis not present

## 2019-03-05 DIAGNOSIS — Z9181 History of falling: Secondary | ICD-10-CM | POA: Diagnosis not present

## 2019-03-05 DIAGNOSIS — I10 Essential (primary) hypertension: Secondary | ICD-10-CM | POA: Diagnosis not present

## 2019-03-05 DIAGNOSIS — G309 Alzheimer's disease, unspecified: Secondary | ICD-10-CM | POA: Diagnosis not present

## 2019-03-06 DIAGNOSIS — I10 Essential (primary) hypertension: Secondary | ICD-10-CM | POA: Diagnosis not present

## 2019-03-06 DIAGNOSIS — F0280 Dementia in other diseases classified elsewhere without behavioral disturbance: Secondary | ICD-10-CM | POA: Diagnosis not present

## 2019-03-06 DIAGNOSIS — R627 Adult failure to thrive: Secondary | ICD-10-CM | POA: Diagnosis not present

## 2019-03-06 DIAGNOSIS — Z9181 History of falling: Secondary | ICD-10-CM | POA: Diagnosis not present

## 2019-03-06 DIAGNOSIS — H409 Unspecified glaucoma: Secondary | ICD-10-CM | POA: Diagnosis not present

## 2019-03-06 DIAGNOSIS — G309 Alzheimer's disease, unspecified: Secondary | ICD-10-CM | POA: Diagnosis not present

## 2019-03-06 DIAGNOSIS — F419 Anxiety disorder, unspecified: Secondary | ICD-10-CM | POA: Diagnosis not present

## 2019-03-08 DIAGNOSIS — I10 Essential (primary) hypertension: Secondary | ICD-10-CM | POA: Diagnosis not present

## 2019-03-08 DIAGNOSIS — F419 Anxiety disorder, unspecified: Secondary | ICD-10-CM | POA: Diagnosis not present

## 2019-03-08 DIAGNOSIS — Z9181 History of falling: Secondary | ICD-10-CM | POA: Diagnosis not present

## 2019-03-08 DIAGNOSIS — F0280 Dementia in other diseases classified elsewhere without behavioral disturbance: Secondary | ICD-10-CM | POA: Diagnosis not present

## 2019-03-08 DIAGNOSIS — G309 Alzheimer's disease, unspecified: Secondary | ICD-10-CM | POA: Diagnosis not present

## 2019-03-08 DIAGNOSIS — R627 Adult failure to thrive: Secondary | ICD-10-CM | POA: Diagnosis not present

## 2019-03-08 DIAGNOSIS — H409 Unspecified glaucoma: Secondary | ICD-10-CM | POA: Diagnosis not present

## 2019-03-11 ENCOUNTER — Telehealth: Payer: Self-pay | Admitting: Nurse Practitioner

## 2019-03-11 NOTE — Telephone Encounter (Signed)
New Message  Patient's daughter is calling in to get approval to accompany her father to his appointment on 03/14/19 at 11:00 am. States that patient has dementia and walks with a walker, so he needs assistance. Please give patient's daughter a call back to confirm.

## 2019-03-11 NOTE — Telephone Encounter (Signed)
Pts daughter called to report the pt has had vision loss, dementia, and cannot walk without assistance... she will need to accompany him upstairs for his appt.. she will wear a mask and has not had any exposure to COVID as far as she knows... she has maintained social distancing so she does not risk exposing the pt as well. He is 85 yo.

## 2019-03-12 NOTE — Progress Notes (Signed)
Electrophysiology Office Note Date: 03/14/2019  ID:  [name], DOB [birth year], MRN [id number]  PCP: No primary care provider on file. Primary Cardiologist: [name] Electrophysiologist: [name]  CC: Pacemaker follow-up  [name] is a 85 y.o. male seen today for Dr [name].  He presents today for routine electrophysiology followup.  Since last being seen in our clinic, the patient reports doing relatively well.  He is seen today with his daughter.  He denies chest pain, palpitations, dyspnea, PND, orthopnea, nausea, vomiting, dizziness, syncope, edema, weight gain, or early satiety.  Device History: MDT dual chamber PPM implanted 2010 for complete heart block by Dr Verlon Setting, now followed by Dr [name].   Past Medical History:  Diagnosis Date  . Atrioventricular block, complete (HCC)    a. s/p MDT ADDRL1 pacemaker implanted by Dr Verlon Setting  . BPH (benign prostatic hyperplasia)   . CAD (coronary artery disease) 05/02/2013   a. Prior cardiac cath (mild LAD disease of 30-40% stenosis, 75% proximal stenosis of diagonal branch and medically management). hISTORY OF PACEMAKER, NON-OBSTRUCTIVE CAD MEDICALLY STABLE.  . Essential hypertension, benign   . Glaucoma   . Hematuria, unspecified   . History of colon cancer, stage III   . Hyperlipidemia   . Ulcer    DU  . Uveitis    Past Surgical History:  Procedure Laterality Date  . CARDIAC CATHETERIZATION     Prior cardiac cath (mild LAD disease of 30-40% stenosis, 75% proximal stenosis of diagonal branch. and medically management)  . PACEMAKER INSERTION  11/2008   MDT Adapta L implanted for Complete heart block by Dr Verlon Setting  . PARTIAL COLECTOMY  04/2007  . TRANSURETHRAL RESECTION OF PROSTATE  2002    Current Outpatient Medications  Medication Sig Dispense Refill  . amLODipine (NORVASC) 5 MG tablet TAKE 1 TABLET BY MOUTH  DAILY 90 tablet 2  . aspirin 81 MG tablet Take 81 mg by mouth every other day.     . cholecalciferol  (VITAMIN D) 1000 UNITS tablet Take 2,000 Units by mouth daily.    Marland Kitchen donepezil (ARICEPT) 10 MG tablet TAKE 1 TABLET (10 MG) BY MOUTH DAILY AT BEDTIME FOR MEMORY    . latanoprost (XALATAN) 0.005 % ophthalmic solution Place 1 drop into both eyes at bedtime.     . metoprolol succinate (TOPROL-XL) 25 MG 24 hr tablet Take 1 tablet (25 mg total) by mouth daily. 90 tablet 3  . Multiple Vitamins-Minerals (MULTIVITAMIN PO) Take 1 capsule by mouth every morning.     Marland Kitchen omeprazole (PRILOSEC) 20 MG capsule Take 20 mg by mouth daily.    . simvastatin (ZOCOR) 20 MG tablet TAKE 1 TABLET BY MOUTH  EVERY EVENING 90 tablet 2   No current facility-administered medications for this visit.     Allergies:   Levaquin [levofloxacin in d5w]   Social History: Social History   Socioeconomic History  . Marital status: Married    Spouse name: Not on file  . Number of children: Not on file  . Years of education: Not on file  . Highest education level: Not on file  Occupational History  . Not on file  Social Needs  . Financial resource strain: Not on file  . Food insecurity    Worry: Not on file    Inability: Not on file  . Transportation needs    Medical: Not on file    Non-medical: Not on file  Tobacco Use  . Smoking status: Never Smoker  . Smokeless  tobacco: Never Used  Substance and Sexual Activity  . Alcohol use: No    Alcohol/week: 0.0 standard drinks  . Drug use: No  . Sexual activity: Not on file  Lifestyle  . Physical activity    Days per week: Not on file    Minutes per session: Not on file  . Stress: Not on file  Relationships  . Social Herbalist on phone: Not on file    Gets together: Not on file    Attends religious service: Not on file    Active member of club or organization: Not on file    Attends meetings of clubs or organizations: Not on file    Relationship status: Not on file  . Intimate partner violence    Fear of current or ex partner: Not on file    Emotionally  abused: Not on file    Physically abused: Not on file    Forced sexual activity: Not on file  Other Topics Concern  . Not on file  Social History Narrative  . Not on file    Family History: Family History  Problem Relation Age of Onset  . Heart disease Mother   . CAD Mother   . Heart disease Father   . CAD Father   . Heart disease Brother   . CAD Brother   . Diabetes Sister   . Gout Brother   . Alcohol abuse Brother   . Liver disease Brother      Review of Systems: All other systems reviewed and are otherwise negative except as noted above.   Physical Exam: VS:  BP (!) 118/58   Pulse 65   Ht 5\' 11"  (1.803 m)   Wt 191 lb (86.6 kg)   SpO2 95%   BMI 26.64 kg/m  , BMI Body mass index is 26.64 kg/m.  GEN- The patient is well appearing, alert and oriented x 3 today.   HEENT: normocephalic, atraumatic; sclera clear, conjunctiva pink; hearing intact; oropharynx clear; neck supple  Lungs- Clear to ausculation bilaterally, normal work of breathing.  No wheezes, rales, rhonchi Heart- Regular rate and rhythm  GI- soft, non-tender, non-distended, bowel sounds present  Extremities- no clubbing, cyanosis, or edema  MS- no significant deformity or atrophy Skin- warm and dry, no rash or lesion; PPM pocket well healed Psych- euthymic mood, full affect Neuro- strength and sensation are intact  PPM Interrogation- reviewed in detail today,  See PACEART report  EKG:  EKG is not ordered today.  Recent Labs: No results found for requested labs within last 8760 hours.   Wt Readings from Last 3 Encounters:  03/14/19 191 lb (86.6 kg)  08/18/17 190 lb (86.2 kg)  02/28/17 187 lb 6.4 oz (85 kg)      Assessment and Plan:  1.  Complete heart block Normal PPM function See Pace Art report No changes today  2.  HTN Stable No change required today  3.  CAD No recent ischemic symptoms    Current medicines are reviewed at length with the patient today.   The patient does not  have concerns regarding his medicines.  The following changes were made today:  none  Labs/ tests ordered today include: none Orders Placed This Encounter  Procedures  . CUP PACEART INCLINIC DEVICE CHECK     Disposition:   Follow up with [facility], me in 1 year      Signed, Chanetta Marshall, NP 03/14/2019 11:19 AM  [facility] [address] [place] [address] [phone] (office) [phone] (fax)

## 2019-03-14 ENCOUNTER — Encounter: Payer: Self-pay | Admitting: Nurse Practitioner

## 2019-03-14 ENCOUNTER — Other Ambulatory Visit: Payer: Self-pay

## 2019-03-14 ENCOUNTER — Ambulatory Visit: Payer: Medicare PPO | Admitting: Nurse Practitioner

## 2019-03-14 VITALS — BP 118/58 | HR 65 | Ht 71.0 in | Wt 191.0 lb

## 2019-03-14 DIAGNOSIS — I442 Atrioventricular block, complete: Secondary | ICD-10-CM | POA: Diagnosis not present

## 2019-03-14 DIAGNOSIS — I10 Essential (primary) hypertension: Secondary | ICD-10-CM

## 2019-03-14 DIAGNOSIS — I2510 Atherosclerotic heart disease of native coronary artery without angina pectoris: Secondary | ICD-10-CM | POA: Diagnosis not present

## 2019-03-14 LAB — CUP PACEART INCLINIC DEVICE CHECK
Date Time Interrogation Session: 20201015111914
Implantable Lead Implant Date: 20100726
Implantable Lead Implant Date: 20100726
Implantable Lead Location: 753859
Implantable Lead Location: 753860
Implantable Lead Model: 5076
Implantable Lead Model: 5076
Implantable Pulse Generator Implant Date: 20100726

## 2019-03-14 MED ORDER — METOPROLOL SUCCINATE ER 25 MG PO TB24: 25.0000 mg | ORAL_TABLET | Freq: Every day | ORAL | 3 refills | Status: DC

## 2019-03-14 NOTE — Patient Instructions (Signed)
Medication Instructions:  NONE *If you need a refill on your cardiac medications before your next appointment, please call your pharmacy*  Lab Work: NONE If you have labs (blood work) drawn today and your tests are completely normal, you will receive your results only by: Marland Kitchen MyChart Message (if you have MyChart) OR . A paper copy in the mail If you have any lab test that is abnormal or we need to change your treatment, we will call you to review the results.  Testing/Procedures: NONE  Follow-Up: At [facility], you and your health needs are our priority.  As part of our continuing mission to provide you with exceptional heart care, we have created designated Provider Care Teams.  These Care Teams include your primary Cardiologist (physician) and Advanced Practice Providers (APPs -  Physician Assistants and Nurse Practitioners) who all work together to provide you with the care you need, when you need it.  Your next appointment:   12 months  The format for your next appointment:   In Person  Provider:   Chanetta Marshall, PA  Other Instructions Remote monitoring is used to monitor your Pacemaker  from home. This monitoring reduces the number of office visits required to check your device to one time per year. It allows [place] to keep an eye on the functioning of your device to ensure it is working properly. You are scheduled for a device check from home on 06/13/19. You may send your transmission at any time that day. If you have a wireless device, the transmission will be sent automatically. After your physician reviews your transmission, you will receive a postcard with your next transmission date.

## 2019-06-13 ENCOUNTER — Ambulatory Visit (INDEPENDENT_AMBULATORY_CARE_PROVIDER_SITE_OTHER): Payer: Medicare PPO | Admitting: *Deleted

## 2019-06-13 DIAGNOSIS — I442 Atrioventricular block, complete: Secondary | ICD-10-CM | POA: Diagnosis not present

## 2019-06-13 LAB — CUP PACEART REMOTE DEVICE CHECK
Battery Impedance: 2285 Ohm
Battery Remaining Longevity: 27 mo
Battery Voltage: 2.73 V
Brady Statistic AP VP Percent: 92 %
Brady Statistic AP VS Percent: 0 %
Brady Statistic AS VP Percent: 8 %
Brady Statistic AS VS Percent: 0 %
Date Time Interrogation Session: 20210114152518
Implantable Lead Implant Date: 20100726
Implantable Lead Implant Date: 20100726
Implantable Lead Location: 753859
Implantable Lead Location: 753860
Implantable Lead Model: 5076
Implantable Lead Model: 5076
Implantable Pulse Generator Implant Date: 20100726
Lead Channel Impedance Value: 500 Ohm
Lead Channel Impedance Value: 541 Ohm
Lead Channel Pacing Threshold Amplitude: 0.625 V
Lead Channel Pacing Threshold Amplitude: 1.25 V
Lead Channel Pacing Threshold Pulse Width: 0.4 ms
Lead Channel Pacing Threshold Pulse Width: 0.4 ms
Lead Channel Setting Pacing Amplitude: 2 V
Lead Channel Setting Pacing Amplitude: 2.5 V
Lead Channel Setting Pacing Pulse Width: 0.4 ms
Lead Channel Setting Sensing Sensitivity: 2 mV

## 2019-06-14 NOTE — Progress Notes (Signed)
PPM remote 

## 2019-09-25 ENCOUNTER — Ambulatory Visit (INDEPENDENT_AMBULATORY_CARE_PROVIDER_SITE_OTHER): Payer: Medicare PPO | Admitting: *Deleted

## 2019-09-25 DIAGNOSIS — I442 Atrioventricular block, complete: Secondary | ICD-10-CM

## 2019-09-25 LAB — CUP PACEART REMOTE DEVICE CHECK
Battery Impedance: 2741 Ohm
Battery Remaining Longevity: 22 mo
Battery Voltage: 2.73 V
Brady Statistic AP VP Percent: 94 %
Brady Statistic AP VS Percent: 0 %
Brady Statistic AS VP Percent: 6 %
Brady Statistic AS VS Percent: 0 %
Date Time Interrogation Session: 20210428124712
Implantable Lead Implant Date: 20100726
Implantable Lead Implant Date: 20100726
Implantable Lead Location: 753859
Implantable Lead Location: 753860
Implantable Lead Model: 5076
Implantable Lead Model: 5076
Implantable Pulse Generator Implant Date: 20100726
Lead Channel Impedance Value: 520 Ohm
Lead Channel Impedance Value: 572 Ohm
Lead Channel Pacing Threshold Amplitude: 0.625 V
Lead Channel Pacing Threshold Amplitude: 1.5 V
Lead Channel Pacing Threshold Pulse Width: 0.4 ms
Lead Channel Pacing Threshold Pulse Width: 0.4 ms
Lead Channel Setting Pacing Amplitude: 2.25 V
Lead Channel Setting Pacing Amplitude: 2.5 V
Lead Channel Setting Pacing Pulse Width: 0.4 ms
Lead Channel Setting Sensing Sensitivity: 2 mV

## 2019-09-26 NOTE — Progress Notes (Signed)
PPM Remote  

## 2020-01-07 ENCOUNTER — Ambulatory Visit (INDEPENDENT_AMBULATORY_CARE_PROVIDER_SITE_OTHER): Payer: Medicare PPO | Admitting: *Deleted

## 2020-01-07 DIAGNOSIS — I442 Atrioventricular block, complete: Secondary | ICD-10-CM

## 2020-01-07 LAB — CUP PACEART REMOTE DEVICE CHECK
Battery Impedance: 3035 Ohm
Battery Remaining Longevity: 20 mo
Battery Voltage: 2.72 V
Brady Statistic AP VP Percent: 96 %
Brady Statistic AP VS Percent: 0 %
Brady Statistic AS VP Percent: 4 %
Brady Statistic AS VS Percent: 0 %
Date Time Interrogation Session: 20210810084213
Implantable Lead Implant Date: 20100726
Implantable Lead Implant Date: 20100726
Implantable Lead Location: 753859
Implantable Lead Location: 753860
Implantable Lead Model: 5076
Implantable Lead Model: 5076
Implantable Pulse Generator Implant Date: 20100726
Lead Channel Impedance Value: 512 Ohm
Lead Channel Impedance Value: 587 Ohm
Lead Channel Pacing Threshold Amplitude: 0.625 V
Lead Channel Pacing Threshold Amplitude: 1.25 V
Lead Channel Pacing Threshold Pulse Width: 0.4 ms
Lead Channel Pacing Threshold Pulse Width: 0.4 ms
Lead Channel Setting Pacing Amplitude: 2 V
Lead Channel Setting Pacing Amplitude: 2.5 V
Lead Channel Setting Pacing Pulse Width: 0.4 ms
Lead Channel Setting Sensing Sensitivity: 2 mV

## 2020-01-08 NOTE — Progress Notes (Signed)
Remote pacemaker transmission.   

## 2020-03-11 ENCOUNTER — Other Ambulatory Visit: Payer: Self-pay | Admitting: Nurse Practitioner

## 2020-04-29 DEATH — deceased
